# Patient Record
Sex: Female | Born: 1950 | Race: White | Hispanic: No | Smoking: Current every day smoker
Health system: Southern US, Community
[De-identification: ages and names within clinical notes are randomized; demographics above are authoritative.]

## PROBLEM LIST (undated history)

## (undated) DIAGNOSIS — C801 Malignant (primary) neoplasm, unspecified: Secondary | ICD-10-CM

## (undated) DIAGNOSIS — I10 Essential (primary) hypertension: Secondary | ICD-10-CM

## (undated) DIAGNOSIS — F329 Major depressive disorder, single episode, unspecified: Secondary | ICD-10-CM

## (undated) HISTORY — PX: HERNIA REPAIR: SHX51

## (undated) HISTORY — PX: ABDOMINAL HYSTERECTOMY: SHX81

## (undated) HISTORY — PX: OTHER SURGICAL HISTORY: SHX169

---

## 2006-09-13 ENCOUNTER — Other Ambulatory Visit: Payer: Self-pay

## 2006-09-13 ENCOUNTER — Emergency Department: Payer: Self-pay | Admitting: Emergency Medicine

## 2006-11-02 ENCOUNTER — Ambulatory Visit: Payer: Self-pay | Admitting: Gastroenterology

## 2007-04-25 ENCOUNTER — Ambulatory Visit: Payer: Self-pay | Admitting: Family Medicine

## 2008-04-25 ENCOUNTER — Ambulatory Visit: Payer: Self-pay | Admitting: Family Medicine

## 2008-05-22 ENCOUNTER — Other Ambulatory Visit: Payer: Self-pay

## 2008-05-22 ENCOUNTER — Ambulatory Visit: Payer: Self-pay | Admitting: Specialist

## 2008-05-29 ENCOUNTER — Ambulatory Visit: Payer: Self-pay | Admitting: Specialist

## 2008-08-07 ENCOUNTER — Emergency Department: Payer: Self-pay | Admitting: Emergency Medicine

## 2008-08-07 ENCOUNTER — Other Ambulatory Visit: Payer: Self-pay

## 2008-10-09 ENCOUNTER — Encounter: Payer: Self-pay | Admitting: Specialist

## 2008-10-21 ENCOUNTER — Ambulatory Visit: Payer: Self-pay | Admitting: Pain Medicine

## 2008-10-22 ENCOUNTER — Ambulatory Visit: Payer: Self-pay | Admitting: Pain Medicine

## 2008-11-06 ENCOUNTER — Ambulatory Visit: Payer: Self-pay | Admitting: Physician Assistant

## 2009-01-01 ENCOUNTER — Ambulatory Visit: Payer: Self-pay | Admitting: Pain Medicine

## 2009-01-07 ENCOUNTER — Ambulatory Visit: Payer: Self-pay | Admitting: Pain Medicine

## 2009-01-21 ENCOUNTER — Ambulatory Visit: Payer: Self-pay | Admitting: Physician Assistant

## 2009-04-29 ENCOUNTER — Ambulatory Visit: Payer: Self-pay | Admitting: Family Medicine

## 2010-05-04 ENCOUNTER — Ambulatory Visit: Payer: Self-pay | Admitting: Family Medicine

## 2011-05-10 ENCOUNTER — Ambulatory Visit: Payer: Self-pay | Admitting: Family Medicine

## 2011-11-30 DIAGNOSIS — F32A Depression, unspecified: Secondary | ICD-10-CM

## 2011-11-30 HISTORY — DX: Depression, unspecified: F32.A

## 2012-06-05 ENCOUNTER — Ambulatory Visit: Payer: Self-pay | Admitting: Family Medicine

## 2012-07-11 ENCOUNTER — Ambulatory Visit: Payer: Self-pay | Admitting: Gynecologic Oncology

## 2012-07-25 ENCOUNTER — Ambulatory Visit: Payer: Self-pay | Admitting: Gynecologic Oncology

## 2012-07-25 LAB — CBC
HCT: 39.3 % (ref 35.0–47.0)
MCHC: 34 g/dL (ref 32.0–36.0)
MCV: 94 fL (ref 80–100)
Platelet: 193 10*3/uL (ref 150–440)
RDW: 13 % (ref 11.5–14.5)
WBC: 7 10*3/uL (ref 3.6–11.0)

## 2012-07-25 LAB — BASIC METABOLIC PANEL
Calcium, Total: 8.8 mg/dL (ref 8.5–10.1)
Chloride: 108 mmol/L — ABNORMAL HIGH (ref 98–107)
Osmolality: 277 (ref 275–301)
Potassium: 3.4 mmol/L — ABNORMAL LOW (ref 3.5–5.1)
Sodium: 140 mmol/L (ref 136–145)

## 2012-07-30 ENCOUNTER — Ambulatory Visit: Payer: Self-pay | Admitting: Gynecologic Oncology

## 2012-08-01 ENCOUNTER — Ambulatory Visit: Payer: Self-pay | Admitting: Gynecologic Oncology

## 2012-08-29 ENCOUNTER — Ambulatory Visit: Payer: Self-pay | Admitting: Gynecologic Oncology

## 2012-09-29 ENCOUNTER — Ambulatory Visit: Payer: Self-pay | Admitting: Gynecologic Oncology

## 2012-10-03 ENCOUNTER — Ambulatory Visit: Payer: Self-pay | Admitting: Gynecologic Oncology

## 2012-10-29 ENCOUNTER — Ambulatory Visit: Payer: Self-pay | Admitting: Gynecologic Oncology

## 2013-01-15 ENCOUNTER — Ambulatory Visit: Payer: Self-pay | Admitting: Gynecologic Oncology

## 2013-01-27 ENCOUNTER — Ambulatory Visit: Payer: Self-pay | Admitting: Gynecologic Oncology

## 2013-02-01 ENCOUNTER — Ambulatory Visit: Payer: Self-pay | Admitting: Gynecologic Oncology

## 2013-02-01 LAB — BASIC METABOLIC PANEL
Anion Gap: 3 — ABNORMAL LOW (ref 7–16)
BUN: 7 mg/dL (ref 7–18)
Chloride: 107 mmol/L (ref 98–107)
Co2: 30 mmol/L (ref 21–32)
EGFR (African American): >60
Potassium: 2.7 mmol/L — ABNORMAL LOW (ref 3.5–5.1)

## 2013-02-01 LAB — CBC
HGB: 13.5 g/dL (ref 12.0–16.0)
MCH: 31.4 pg (ref 26.0–34.0)
Platelet: 198 10*3/uL (ref 150–440)
RDW: 13.2 % (ref 11.5–14.5)

## 2013-02-06 ENCOUNTER — Ambulatory Visit: Payer: Self-pay | Admitting: Gynecologic Oncology

## 2013-02-08 LAB — PATHOLOGY REPORT

## 2013-02-27 ENCOUNTER — Ambulatory Visit: Payer: Self-pay | Admitting: Gynecologic Oncology

## 2013-03-29 ENCOUNTER — Ambulatory Visit: Payer: Self-pay | Admitting: Gynecologic Oncology

## 2013-04-01 ENCOUNTER — Emergency Department: Payer: Self-pay | Admitting: Emergency Medicine

## 2013-04-01 LAB — CK TOTAL AND CKMB (NOT AT ARMC): CK-MB: <0.5 ng/mL — ABNORMAL LOW (ref 0.5–3.6)

## 2013-04-01 LAB — PRO B NATRIURETIC PEPTIDE: B-Type Natriuretic Peptide: 73 pg/mL (ref 0–125)

## 2013-04-01 LAB — BASIC METABOLIC PANEL
Anion Gap: 7 (ref 7–16)
BUN: 6 mg/dL — ABNORMAL LOW (ref 7–18)
Chloride: 111 mmol/L — ABNORMAL HIGH (ref 98–107)
Potassium: 2.8 mmol/L — ABNORMAL LOW (ref 3.5–5.1)
Sodium: 142 mmol/L (ref 136–145)

## 2013-04-01 LAB — CBC
HGB: 13.3 g/dL (ref 12.0–16.0)
MCHC: 34.4 g/dL (ref 32.0–36.0)
RDW: 13.5 % (ref 11.5–14.5)

## 2013-04-01 LAB — MAGNESIUM: Magnesium: 1.3 mg/dL — ABNORMAL LOW

## 2013-04-13 ENCOUNTER — Ambulatory Visit: Payer: Self-pay | Admitting: Family Medicine

## 2013-04-14 LAB — CBC
HCT: 36.6 % (ref 35.0–47.0)
HGB: 12.6 g/dL (ref 12.0–16.0)
MCHC: 34.4 g/dL (ref 32.0–36.0)
MCV: 91 fL (ref 80–100)
Platelet: 204 10*3/uL (ref 150–440)
RBC: 4.03 10*6/uL (ref 3.80–5.20)
RDW: 13.1 % (ref 11.5–14.5)

## 2013-04-15 ENCOUNTER — Inpatient Hospital Stay: Payer: Self-pay | Admitting: Internal Medicine

## 2013-04-15 LAB — URINALYSIS, COMPLETE
Bacteria: NONE SEEN
Bilirubin,UR: NEGATIVE
Blood: NEGATIVE
Glucose,UR: NEGATIVE mg/dL (ref 0–75)
Ketone: NEGATIVE
Leukocyte Esterase: NEGATIVE
Nitrite: NEGATIVE
RBC,UR: <1 /HPF (ref 0–5)
Squamous Epithelial: NONE SEEN
WBC UR: 1 /HPF (ref 0–5)

## 2013-04-15 LAB — COMPREHENSIVE METABOLIC PANEL
Albumin: 3.5 g/dL (ref 3.4–5.0)
Alkaline Phosphatase: 77 U/L (ref 50–136)
Anion Gap: 6 — ABNORMAL LOW (ref 7–16)
Bilirubin,Total: 0.2 mg/dL (ref 0.2–1.0)
Chloride: 111 mmol/L — ABNORMAL HIGH (ref 98–107)
Co2: 26 mmol/L (ref 21–32)
EGFR (African American): >60
EGFR (Non-African Amer.): >60
Glucose: 89 mg/dL (ref 65–99)
Potassium: 3.6 mmol/L (ref 3.5–5.1)
SGOT(AST): 19 U/L (ref 15–37)
SGPT (ALT): 19 U/L (ref 12–78)
Total Protein: 6.6 g/dL (ref 6.4–8.2)

## 2013-04-15 LAB — TROPONIN I: Troponin-I: <0.02 ng/mL

## 2013-04-15 LAB — LIPID PANEL
Cholesterol: 138 mg/dL (ref 0–200)
VLDL Cholesterol, Calc: 26 mg/dL (ref 5–40)

## 2013-04-18 DIAGNOSIS — Z01810 Encounter for preprocedural cardiovascular examination: Secondary | ICD-10-CM

## 2013-04-18 DIAGNOSIS — I059 Rheumatic mitral valve disease, unspecified: Secondary | ICD-10-CM

## 2013-04-18 LAB — CREATININE, SERUM
Creatinine: 0.61 mg/dL (ref 0.60–1.30)
EGFR (African American): >60

## 2013-04-20 LAB — CBC WITH DIFFERENTIAL/PLATELET
Basophil #: 0 10*3/uL (ref 0.0–0.1)
Eosinophil #: 0.1 10*3/uL (ref 0.0–0.7)
HCT: 27.2 % — ABNORMAL LOW (ref 35.0–47.0)
HGB: 9.4 g/dL — ABNORMAL LOW (ref 12.0–16.0)
Lymphocyte #: 3 10*3/uL (ref 1.0–3.6)
Lymphocyte %: 39.3 %
MCH: 31.7 pg (ref 26.0–34.0)
MCV: 91 fL (ref 80–100)
Neutrophil %: 50.5 %
Platelet: 133 10*3/uL — ABNORMAL LOW (ref 150–440)
RBC: 2.98 10*6/uL — ABNORMAL LOW (ref 3.80–5.20)

## 2013-04-20 LAB — BASIC METABOLIC PANEL
Calcium, Total: 8.8 mg/dL (ref 8.5–10.1)
Chloride: 110 mmol/L — ABNORMAL HIGH (ref 98–107)
EGFR (African American): >60
EGFR (Non-African Amer.): >60
Osmolality: 278 (ref 275–301)
Potassium: 3.6 mmol/L (ref 3.5–5.1)

## 2013-04-20 LAB — PROTIME-INR: Prothrombin Time: 13.7 secs (ref 11.5–14.7)

## 2013-04-21 LAB — BASIC METABOLIC PANEL
Anion Gap: 5 — ABNORMAL LOW (ref 7–16)
BUN: 7 mg/dL (ref 7–18)
Calcium, Total: 8.4 mg/dL — ABNORMAL LOW (ref 8.5–10.1)
Co2: 27 mmol/L (ref 21–32)
Creatinine: 0.46 mg/dL — ABNORMAL LOW (ref 0.60–1.30)
EGFR (African American): >60

## 2013-04-21 LAB — CBC WITH DIFFERENTIAL/PLATELET
Basophil #: 0 10*3/uL (ref 0.0–0.1)
Basophil %: 0.6 %
Eosinophil #: 0.1 10*3/uL (ref 0.0–0.7)
Eosinophil %: 0.8 %
HCT: 24.8 % — ABNORMAL LOW (ref 35.0–47.0)
HGB: 8.4 g/dL — ABNORMAL LOW (ref 12.0–16.0)
Lymphocyte #: 2.1 10*3/uL (ref 1.0–3.6)
MCH: 31 pg (ref 26.0–34.0)
MCHC: 33.9 g/dL (ref 32.0–36.0)
MCV: 91 fL (ref 80–100)
Monocyte #: 0.5 x10 3/mm (ref 0.2–0.9)
Neutrophil #: 4.1 10*3/uL (ref 1.4–6.5)
Neutrophil %: 60.3 %
Platelet: 121 10*3/uL — ABNORMAL LOW (ref 150–440)
RBC: 2.71 10*6/uL — ABNORMAL LOW (ref 3.80–5.20)
RDW: 13.1 % (ref 11.5–14.5)
WBC: 6.9 10*3/uL (ref 3.6–11.0)

## 2013-04-21 LAB — PROTIME-INR: INR: 1

## 2013-04-21 LAB — APTT: Activated PTT: 35.5 secs (ref 23.6–35.9)

## 2013-04-24 LAB — PATHOLOGY REPORT

## 2013-06-15 ENCOUNTER — Ambulatory Visit: Payer: Self-pay | Admitting: Gynecologic Oncology

## 2013-06-29 ENCOUNTER — Ambulatory Visit: Payer: Self-pay | Admitting: Gynecologic Oncology

## 2013-07-16 ENCOUNTER — Ambulatory Visit: Payer: Self-pay | Admitting: Family Medicine

## 2013-07-19 ENCOUNTER — Ambulatory Visit: Payer: Self-pay | Admitting: Family Medicine

## 2013-12-22 IMAGING — CR DG CHEST 2V
1 series · 2 of 2 positions shown · non-contrast
Comparison: none

REASON FOR EXAM: cough, eye twitching and difficulty with speech,
previous pneumonia
COMMENTS:

[Series 3: w chest pa · 0.14mm/px · 2 of 2 slices shown]
[im 1/2]
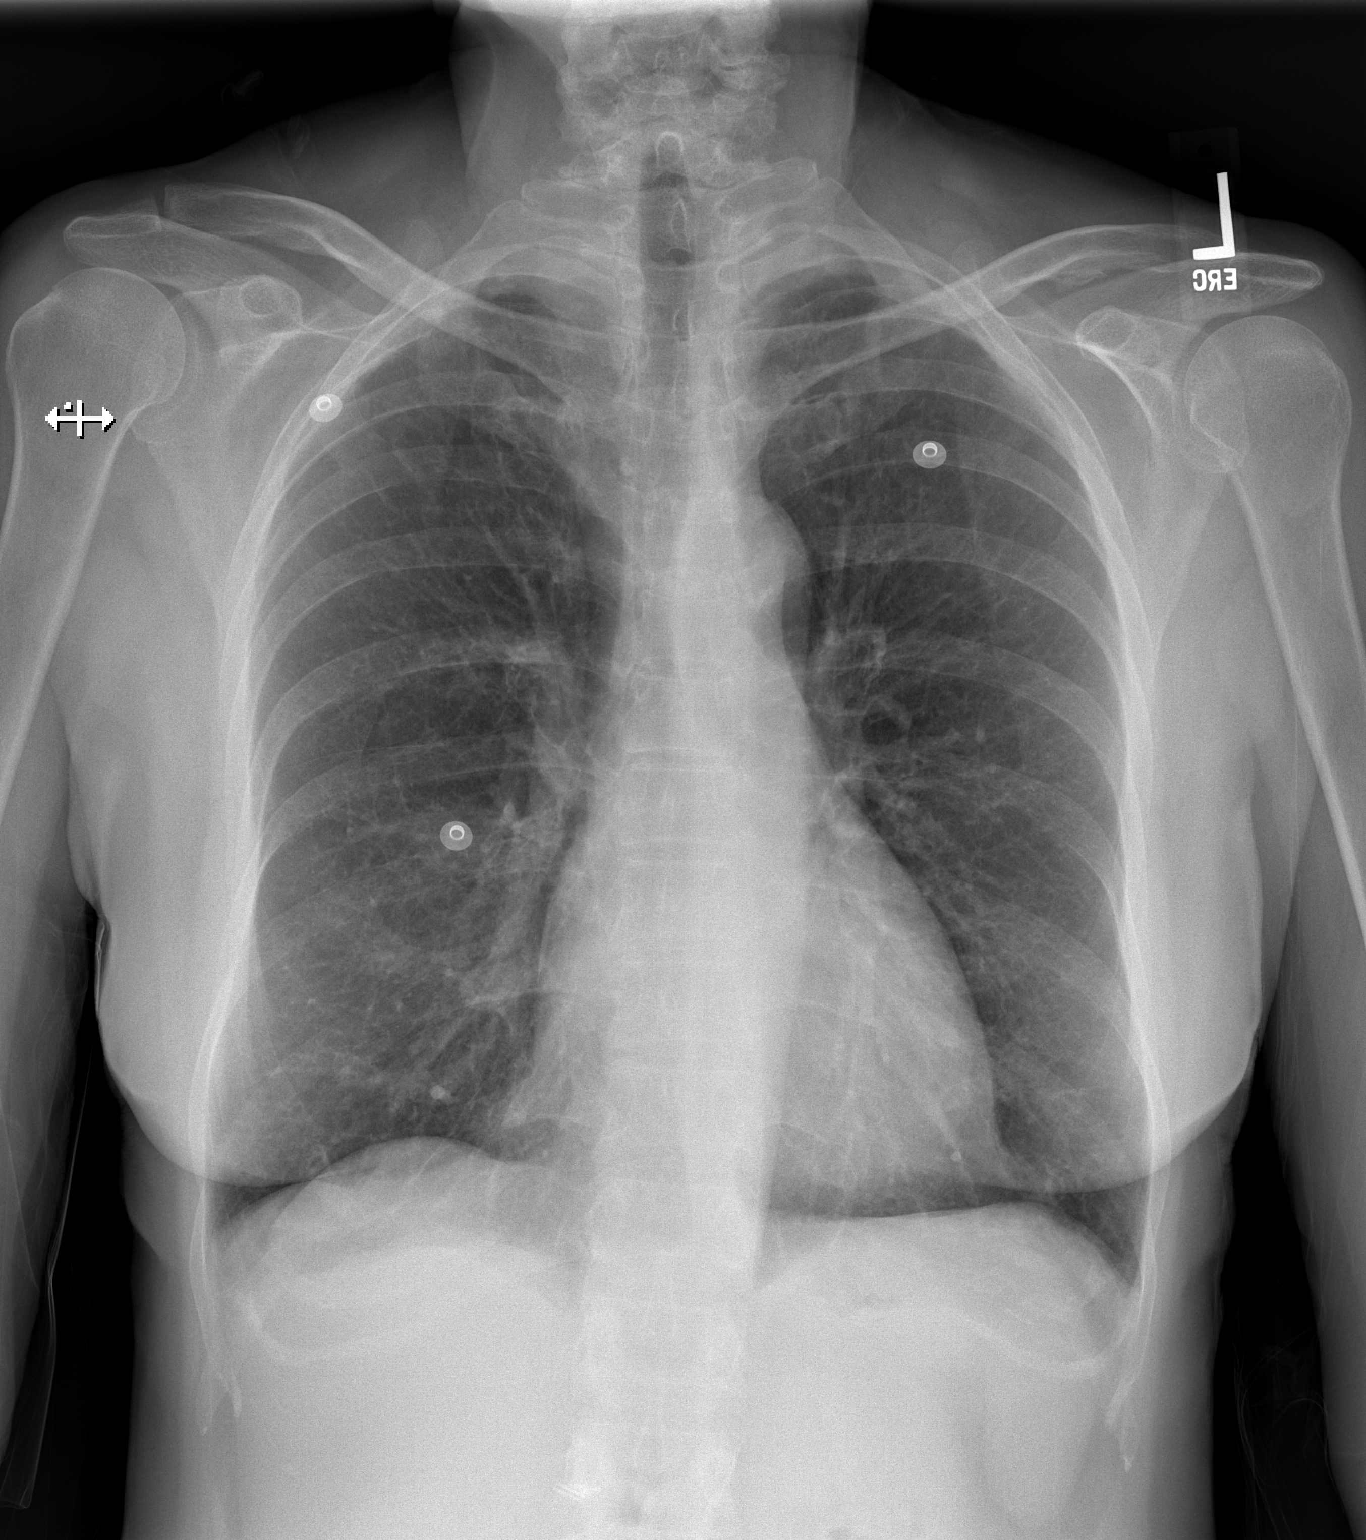
[im 2/2]
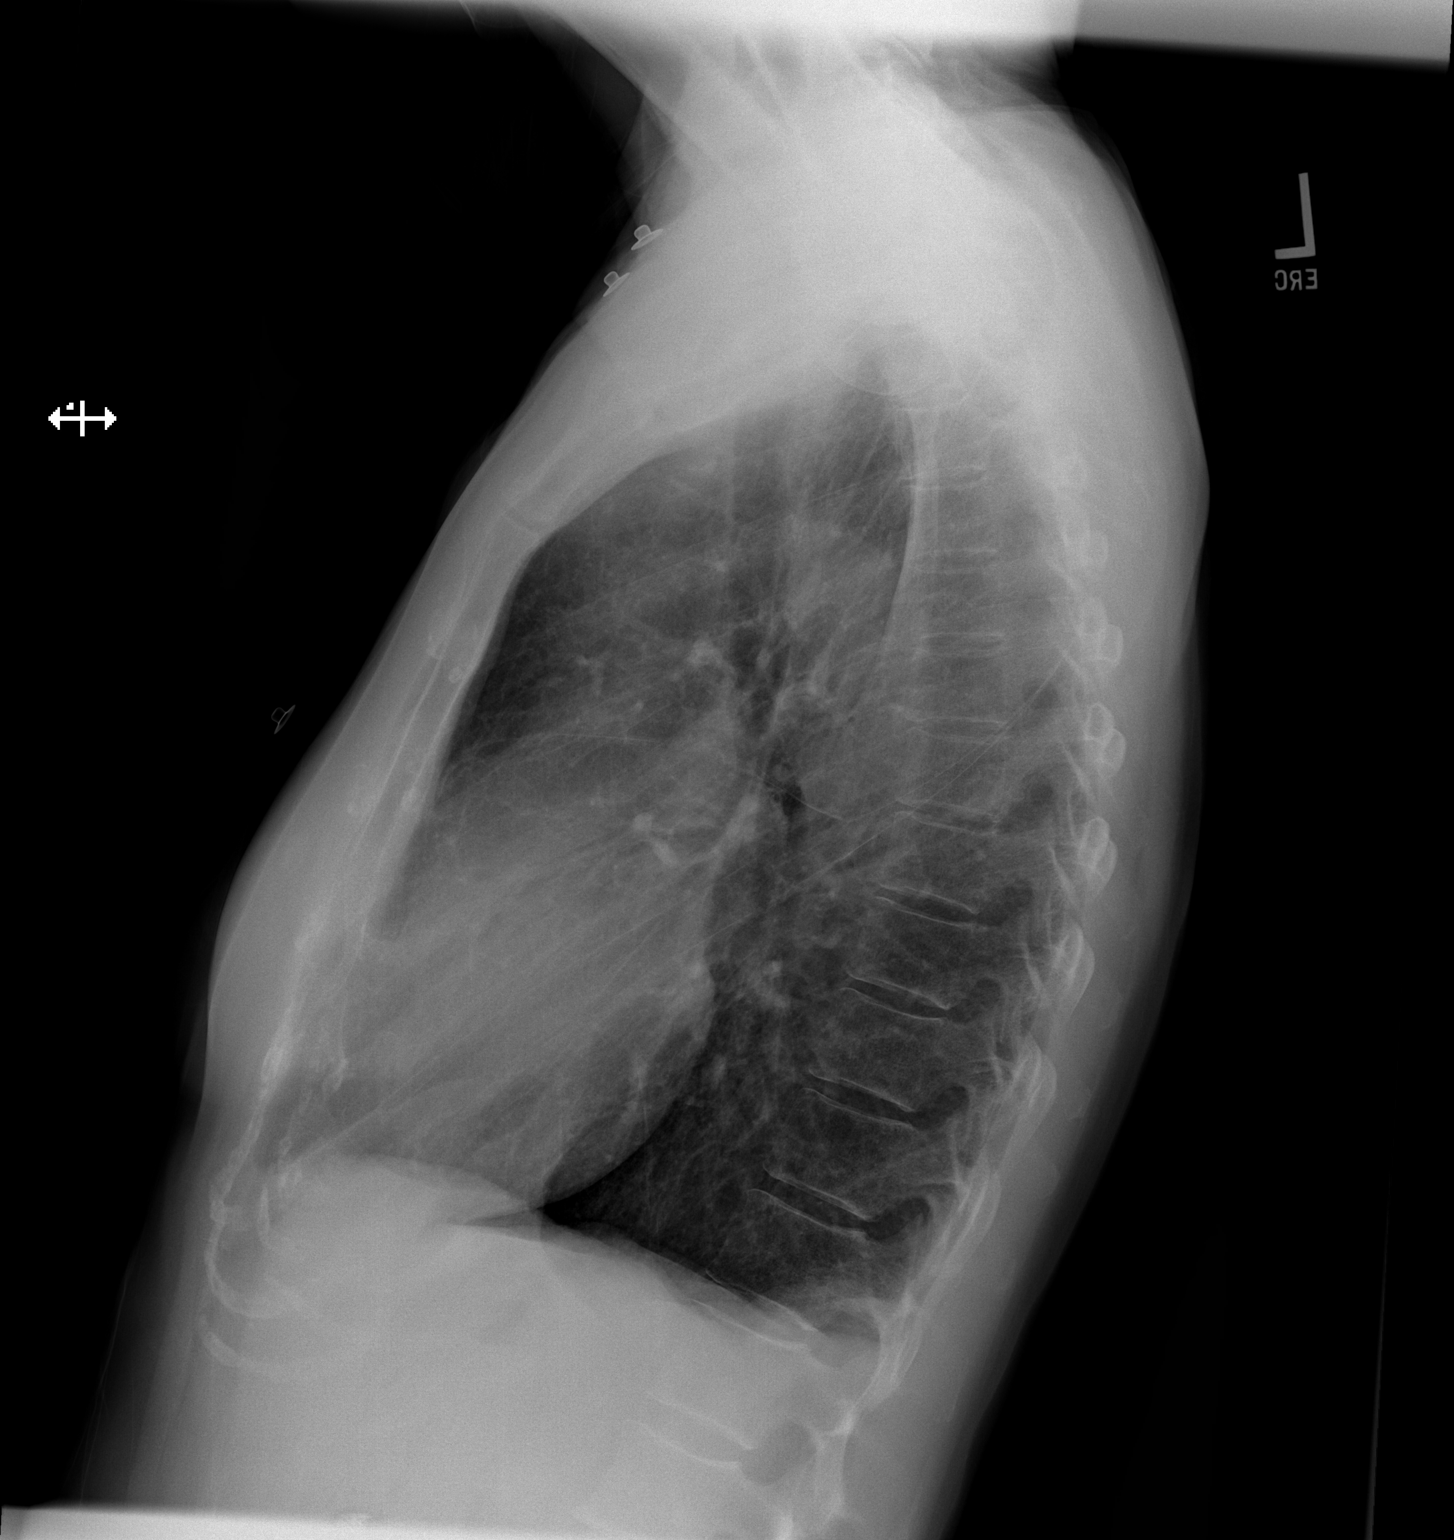

[2 of 2 positions shown; findings below may reference images not displayed]

PROCEDURE:     DXR - DXR CHEST PA (OR AP) AND LATERAL  - April 15, 2013 [DATE]

RESULT:     Comparison made to the study April 01, 2013.

The lungs remain hyperinflated. There is no focal infiltrate. The cardiac
silhouette is normal in size. The pulmonary vascularity is not engorged. The
perihilar lung markings are mildly prominent.
IMPRESSION: The findings are consistent with COPD and likely
superimposed acute bronchitis. There is no focal pneumonia. There is no
significant change since a study of earlier this month. A followup films
following any therapy is recommended to assure clearing. Chest CT scanning
may be ultimately indicated.

[REDACTED]

## 2013-12-22 IMAGING — US US CAROTID DUPLEX BILAT
1 series · 13 of 24 positions shown · non-contrast
Comparison: none

REASON FOR EXAM: TIA and left carotid stenosis
COMMENTS:   LMP: Post-Menopausal

[Series 1: us carotid duplex bilat · 0.08mm/px · 13 of 68 slices shown]
[im 1/68]
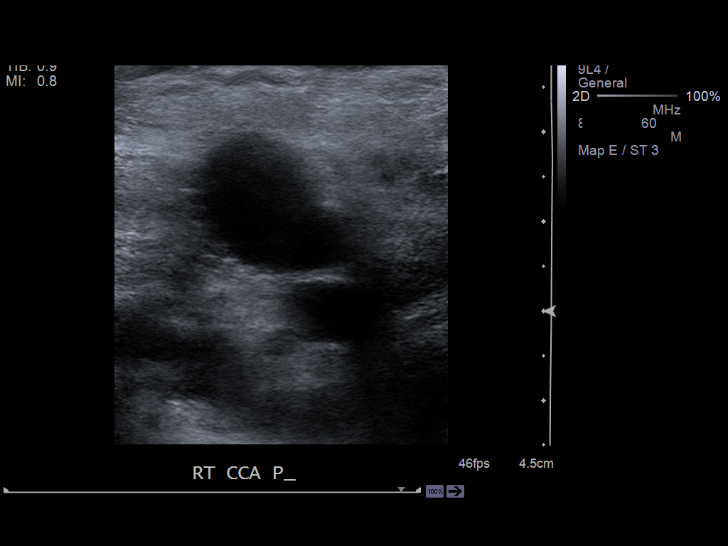
[im 6/68]
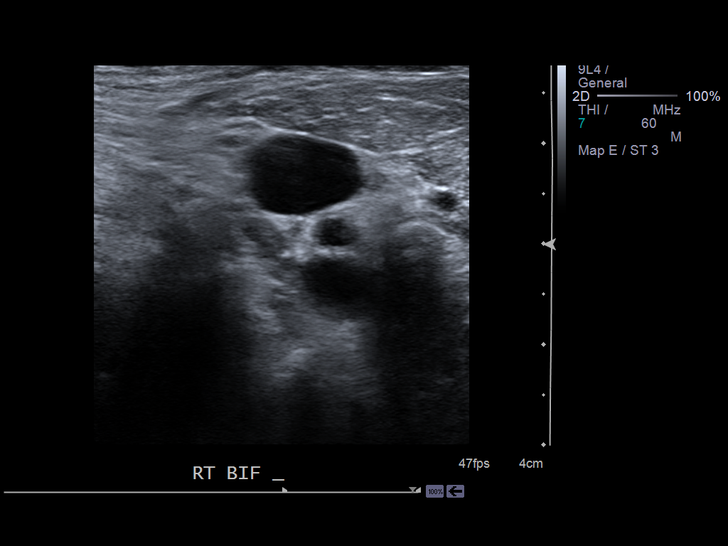
[im 12/68]
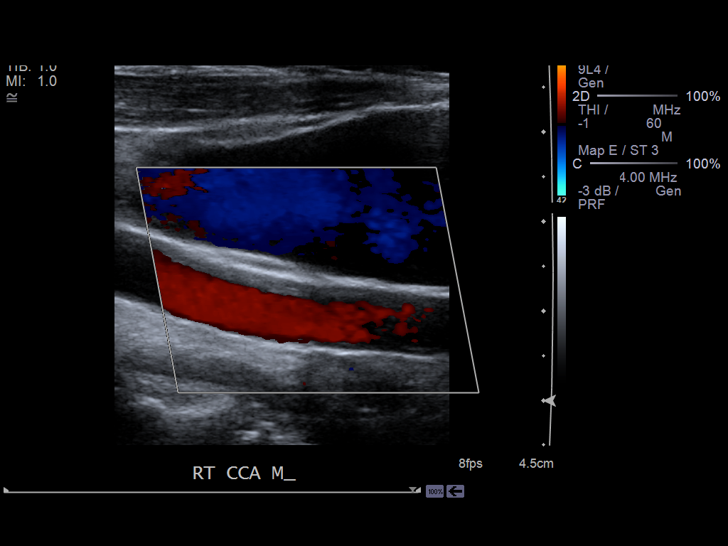
[im 18/68]
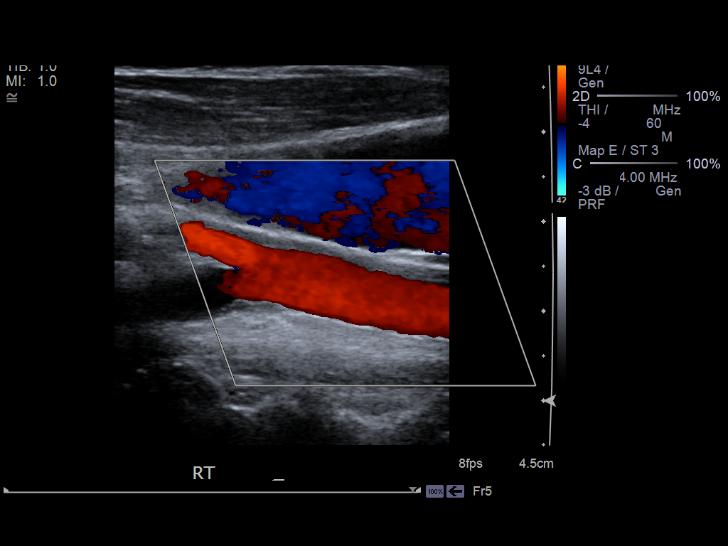
[im 24/68]
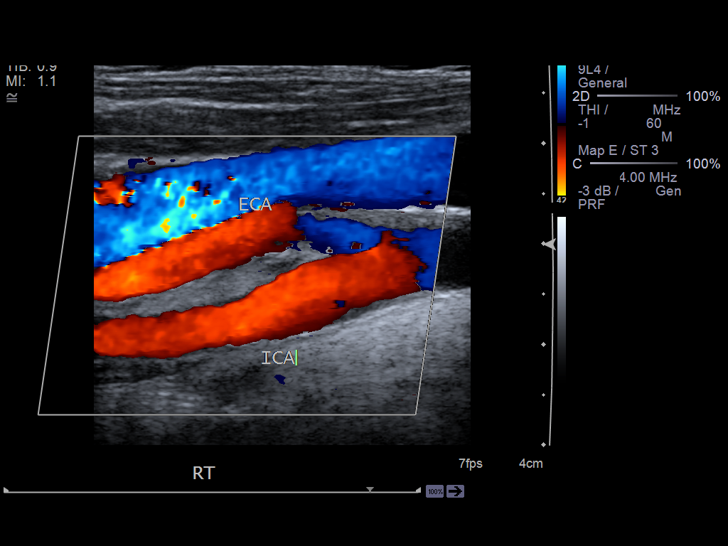
[im 30/68]
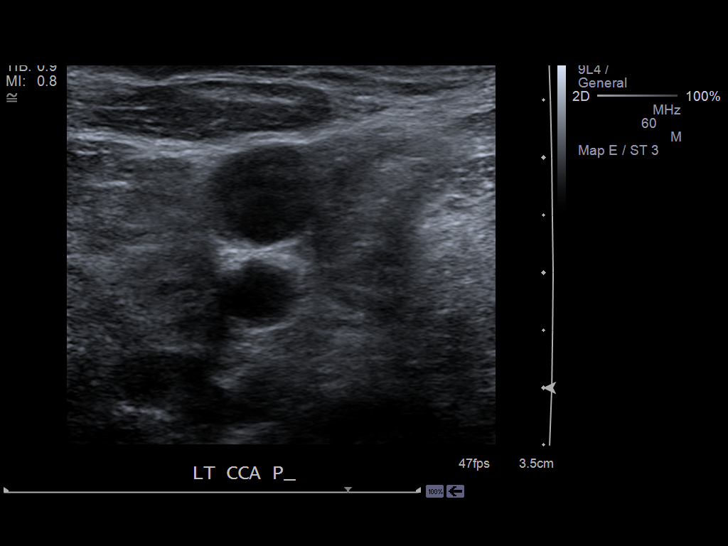
[im 35/68]
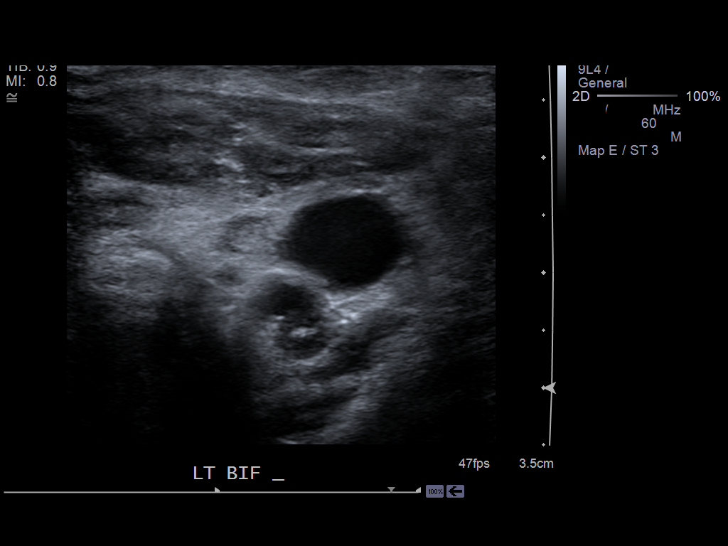
[im 38/68]
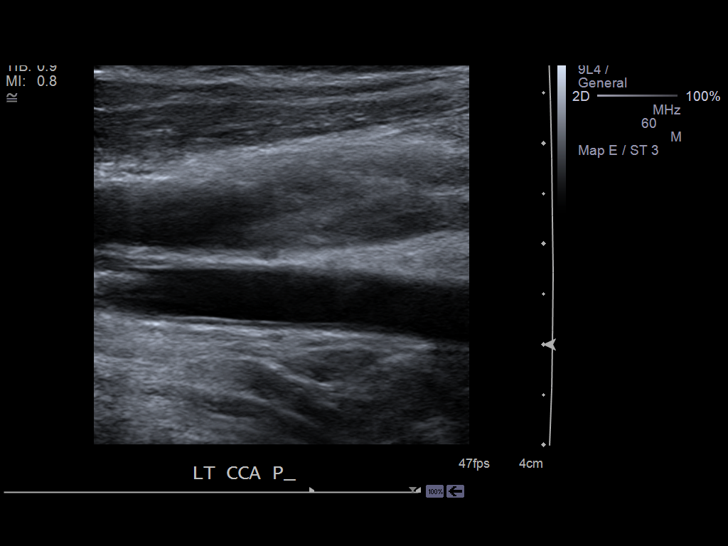
[im 44/68]
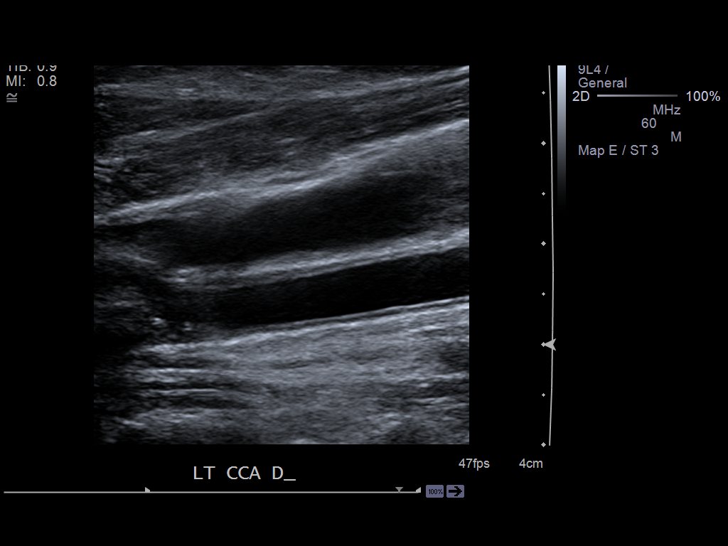
[im 50/68]
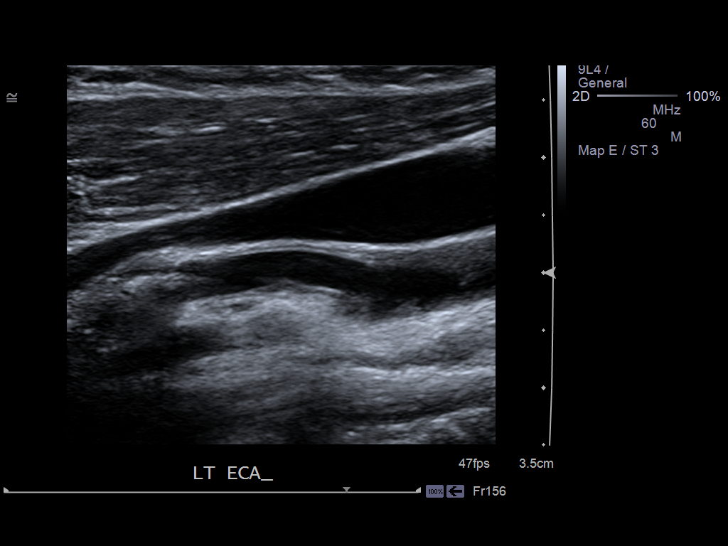
[im 56/68]
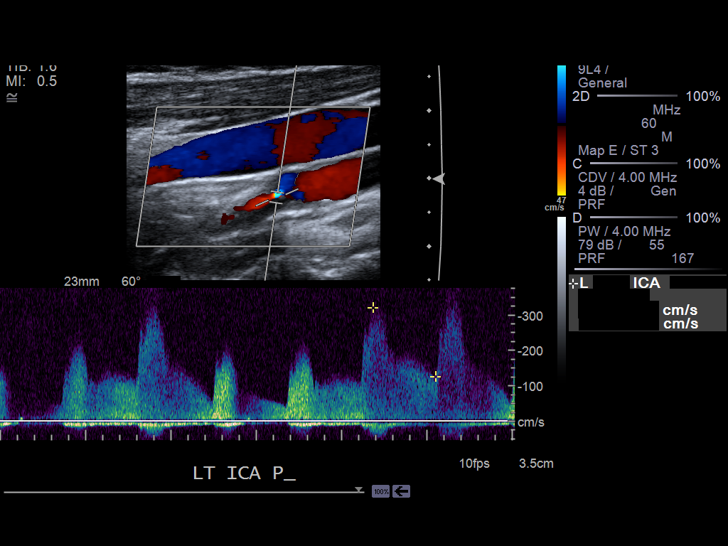
[im 62/68]
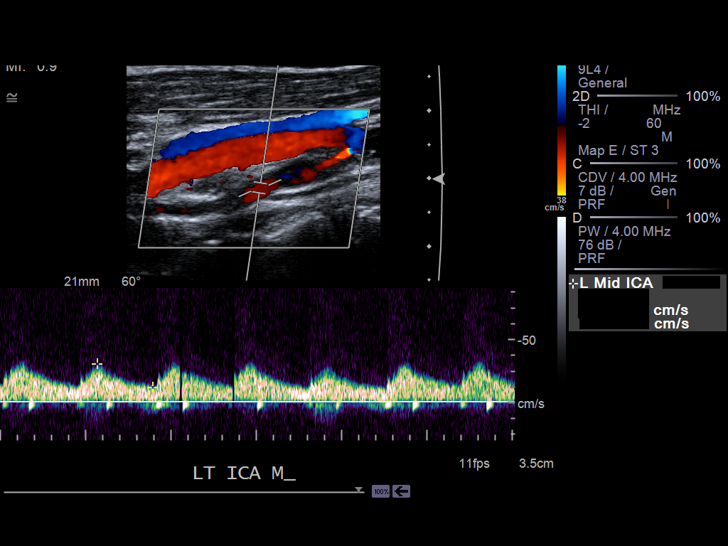
[im 68/68]
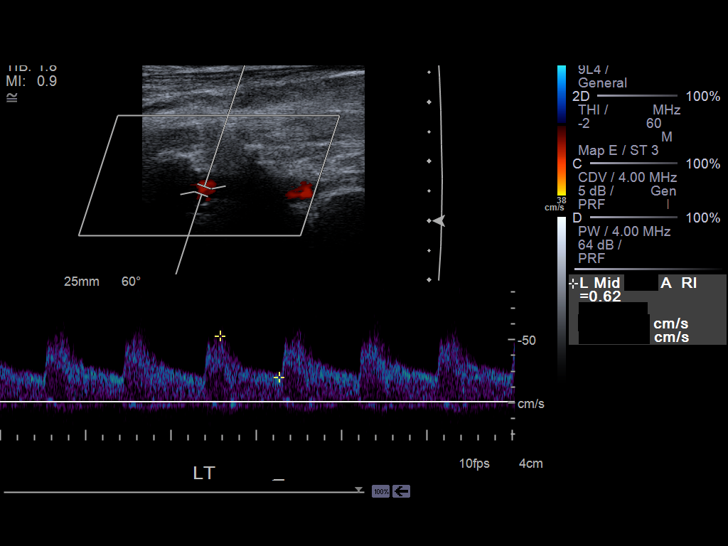

[13 of 24 positions shown; findings below may reference images not displayed]

PROCEDURE:     US  - US CAROTID DOPPLER BILATERAL  - April 15, 2013  [DATE]

RESULT:     Grayscale and color flow Doppler techniques or employed to
evaluate the cervical carotid systems.

On the right there is a small moderate amount of soft plaque throughout the
carotid system. The waveform patterns and color flow images do not suggest
significant turbulence. The peak internal carotid systolic velocity on the
right measures 91 cm/sec and the peak common carotid velocity measured 125
cm/sec corresponding to a normal ratio of 0.7.

On the left there is soft plaque in the distal CCA and there is soft and
calcified plaque at the bulb and proximal ICA. There is turbulence in the
proximal ICA and visually the degree of stenosis is between 75 and 95% in
the proximal ICA. Peak internal carotid systolic velocity is accelerated to
328 cm/sec and the peak common carotid velocity measures 76 cm/sec
corresponding to an abnormal ratio of 4.3.

The vertebral arteries are normal in flow direction bilaterally.
IMPRESSION: 1. Calcified and soft plaque in the proximal ICA is consistent with a degree
of stenosis of 75 to 95%.
2. On the right only a small to amount of soft plaque is noted throughout
the carotid system and normal velocities are demonstrated.
3. The vertebral arteries are normal in flow direction bilaterally.

A preliminary report was sent to the [HOSPITAL] the conclusion
of the study.

[REDACTED]

## 2014-01-14 ENCOUNTER — Ambulatory Visit: Payer: Self-pay | Admitting: Gynecologic Oncology

## 2015-03-18 NOTE — Op Note (Signed)
PATIENT NAME:  Ann Poole, Ann Poole MR#:  [id number] DATE OF BIRTH:  Jul 11, 1951  DATE OF PROCEDURE:  08/01/2012  PREOPERATIVE DIAGNOSIS: Condyloma of vulva and vagina.   POSTOPERATIVE DIAGNOSIS:  Condyloma of vulva and vagina.   PROCEDURE PERFORMED: Examination under anesthesia and laser vaporization.   SURGEON: Weber Cooks, M.D.   ANESTHESIA: General.   COMPLICATIONS: None.   INDICATION FOR SURGERY: Mrs. Cressy is a 64 year old patient who was noted to have moderate volume condyloma in her vagina and small volume condyloma on the vulva. Therefore, the decision was made to proceed with laser vaporization.   FINDINGS AT THE TIME OF SURGERY: External genitalia within normal limits except for small volume condyloma on the posterior fourchette. No clinical evidence of VIN. Moderate volume condyloma on the vagina mainly on the right pelvic sidewall. The vaginal cuff is well healed. On bimanual examination no masses or nodularities.   OPERATIVE REPORT: After adequate general anesthesia had been obtained, the patient was prepped and draped in high lithotomy position. Inspection was done with the above-mentioned findings.   First, attention was directed towards the vagina. Using the CO2 laser at a wattage of 22, the  condylomatous lesions inside the vagina were lasered in layers following the Reed technique. Adequate hemostasis was confirmed. Then attention was directed towards the vulva where again at the same wattage using the continuous CO2 laser, the condylomatous lesions were vaporized. Then Silvadene cream and lidocaine gel were placed inside the vagina and on the vulva.     The patient tolerated the procedure well and was taken to the recovery room in stable condition. Pad, sponge, needle, and instrument counts were correct times two.    ____________________________ Weber Cooks, MD bem:bjt D: 08/01/2012 16:49:29 ET T: 08/01/2012 17:51:01 ET JOB#: [id number]  cc: Weber Cooks, MD, <Dictator> Weber Cooks MD ELECTRONICALLY SIGNED 08/08/2012 10:49

## 2015-03-21 NOTE — Discharge Summary (Signed)
PATIENT NAME:  Ann Poole, Ann Poole MR#:  [id number] DATE OF BIRTH:  September 13, 1951  DATE OF ADMISSION:  04/15/2013 DATE OF DISCHARGE:  04/21/2013  DISCHARGE DIAGNOSES: 1.  Carotid artery stenosis, transient ischemic attack episode, status post surgery on 04/20/2013. 2.  Hypertension.  3.  Anemia due to blood loss in surgery. 4.  CODE STATUS ON DISCHARGE: Full code.  CONDITION ON DISCHARGE: Stable.   MEDICATIONS ON DISCHARGE:  1.  Simvastatin 10 mg oral tablet once a day. 2.  Trazodone 100 mg tablet once a day. 3.  Cyclobenzaprine 5 mg oral tablet as needed.  4.  Citalopram 10 mg oral tablet once a day. 5.  Aspirin 81 mg once a day.  6.  Ferrous sulfate 325 mg 3 times a day.  7. Docusate sodium 240 mg capsule every 12 hours as needed for constipation.  8.  Nicotine patch once a day.  10.  Acetaminophen/hydrocodone 320-5/5 oral tablet every 4 to 6 hours for 5 days.   DIET ON DISCHARGE: Low sodium, low fat, low cholesterol, regular consistency diet.   ACTIVITY: As tolerated.   FOLLOW UP:  Timeframe to follow-up within 2 to 4 weeks with primary care physician and follow up with PMD for hemoglobin check in 3 to 4 weeks.   HISTORY OF PRESENT ILLNESS: This 64 year old Caucasian female with history of hypertension, chronic smoker who was in her usual state of health until 3 to 4 days ago when she had an episode of left-sided headache and fluttering of the vision of her right eye associated with some speech problem also.  She went to her primary care physician and told about this thing. There was possibility of TIA and she was scheduled to have carotid ultrasound and echocardiogram next week. MRI was also done as outpatient. The patient also had similar episode the next day and she was told to go to the Emergency Department.  So she came with slurred speech As per MRI, there was a senior thrombus or stenosis of the left internal carotid artery. The patient was admitted for further management.    HOSPITAL COURSE AND STAY:   1.  Transient ischemic attack. She remained symptom free while in the hospital. Vascular consult was done for left internal carotid artery stenosis or thrombus and they scheduled her for surgery but wanted cardiology clearance which was done.  As cardiology cleared for the surgery, the surgery was scheduled after 4 to 5 days of hospital stay, which was done without any complications. The patient tolerated surgery very well and the next day was walking and without any problem, so discharged home with aspirin.  Advised as to smoking cessation and statin.  2.  Other medical issues are anemia due to blood loss in surgery and dilutional due to IV fluid.  We started on oral supplementation and advised to follow with primary care.  3.  Hypokalemia. Oral supplementation.  4.  Hypotension.  Blood pressure came under control with medication.  5.  Hypercholesterolemia. Advised to go with statin. 6.  Smoking.  Was on a nicotine patch in the hospital and sent home with the same.   CONSULTATIONS IN THE HOSPITAL: 1.  vascular consult with Dr. Delana Meyer.  2.  Cardiology consult with Dr. Rockey Situ.  LABORATORY RESULTS IN THE HOSPITAL:  MRI of brain without contrast, severe stenosis, possible thrombus of left internal carotid  artery which was done as outpatient on 04/13/2013.  Triglyceride was 129, HDL 32. CT head without contrast: No acute intracranial abnormality. CT  angiogram confirmed those findings. Echocardiogram of the heart essentially normal study, left ventricular ejection fraction 55% to 60%. Normal size ventricle and wall thickness, normal right ventricular size and systolic function. Hemoglobin was 12.6 on admission and after surgery it came down to 8.4, We sent her home with replacement orally.   Total time spent on this discharge: 45 minutes.    ____________________________ Ceasar Lund Anselm Jungling, MD vgv:ct D: 04/25/2013 07:12:33 ET T: 04/25/2013 08:43:37  ET JOB#: [id number]  cc: Ceasar Lund. Anselm Jungling, MD, <Dictator> Katha Cabal, MD Primary care physician  Vaughan Basta MD ELECTRONICALLY SIGNED 04/26/2013 22:37

## 2015-03-21 NOTE — Consult Note (Signed)
General Aspect 64 year old Caucasian female with history of hypertension, She is a chronic smoker, presenting after an episode of left-sided headache associated with fluttering of vision in the right eye, at that time associated with some speech problem. found to have severe carotid arterial disease, planned for carotid endarterectomy tomorrow. Cardiology was consulted for preop evaluation.  Patient reports stuttering episodes over the past week or so of TIA-type symptoms with speech changes, memory problem. Symptoms have resolved. She saw her primary care physician and she was told that she had possibly she had a TIA. She was scheduled to have carotid ultrasound and echocardiogram next week. she had an MRI done this Friday. she came to the hospital because she developed a  left-sided headache associated with a fluttering vision in the right eye that lasted about 20 minutes and then subsided. This time did not associate with slurred speech   MRI done on 05/16 showed severe catotid stenosis with possible thrombus in the left internal carotid artery.   Present Illness . FAMILY HISTORY: He father died from complications of pneumonia and he was suffering from [place]???s dementia. Her mother died at age of 38 from COPD complications.   SOCIAL HISTORY: She is divorced. She works as a Gaffer.   SOCIAL HABITS: Chronic smoker of 1/2 pack a day since age of 19. She drinks alcohol only occasionally. No other drug abuse.   Physical Exam:  GEN well developed, well nourished, no acute distress   HEENT hearing intact to voice, moist oral mucosa, Oropharynx clear   NECK supple   RESP normal resp effort  clear BS   CARD Regular rate and rhythm  Normal, S1, S2   ABD denies tenderness  normal BS   LYMPH negative neck   EXTR negative edema   SKIN normal to palpation   NEURO motor/sensory function intact   PSYCH alert, A+O to time, place, person, good insight   Review of Systems:   Subjective/Chief Complaint headache, speech abn   General: No Complaints   Skin: No Complaints   ENT: No Complaints   Eyes: No Complaints   Neck: No Complaints   Respiratory: No Complaints   Cardiovascular: No Complaints   Gastrointestinal: No Complaints   Genitourinary: No Complaints   Vascular: No Complaints   Musculoskeletal: No Complaints   Neurologic: No Complaints   Hematologic: No Complaints   Endocrine: No Complaints   Psychiatric: No Complaints   Review of Systems: All other systems were reviewed and found to be negative   Medications/Allergies Reviewed Medications/Allergies reviewed     TIA:    Hypokalemia:    Anxiety:    Hypertension:    Seizures:    Arm Surgery - Right: pinch nerve - 05-29-08, multiple other surgeries on same arm prior   Cholecystectomy:    Hysterectomy - Partial:    Hernia Repair:   Home Medications: Medication Instructions Status  ProAir HFA CFC free 90 mcg/inh aerosol 2 puffs inhaled every 4 hours as needed for wheezing or shortness of breath Active  simvastatin 10 mg oral tablet 1 tab(s) orally once a day (at bedtime) Active  traZODone 100 mg oral tablet  orally once a day Active  cyclobenzaprine 5 mg oral tablet  orally , As Needed Active  quinapril 20 mg oral tablet 1 tab(s) orally once a day Active  citalopram 10 mg oral tablet 1 tab(s) orally once a day Active   Lab Results:  Hepatic:  17-May-14 23:20   Bilirubin, Total 0.2  Alkaline Phosphatase 77  SGPT (ALT) 19  SGOT (AST) 19  Total Protein, Serum 6.6  Albumin, Serum 3.5  Routine Chem:  17-May-14 23:20   Creatinine (comp) 0.64  eGFR (African American) >60  eGFR (Non-African American) >60 (eGFR values <67m/min/1.73 m2 may be an indication of chronic kidney disease (CKD). Calculated eGFR is useful in patients with stable renal function. The eGFR calculation will not be reliable in acutely ill patients when serum creatinine is changing rapidly. It is  not useful in  patients on dialysis. The eGFR calculation may not be applicable to patients at the low and high extremes of body sizes, pregnant women, and vegetarians.)  Cholesterol, Serum 138  Triglycerides, Serum 129  HDL (INHOUSE)  32  VLDL Cholesterol Calculated 26  LDL Cholesterol Calculated 80 (Result(s) reported on 15 Apr 2013 at 08:55AM.)  Glucose, Serum 89  BUN 15  Sodium, Serum 143  Potassium, Serum 3.6  Chloride, Serum  111  CO2, Serum 26  Calcium (Total), Serum 8.9  Osmolality (calc) 285  Anion Gap  6  Cardiac:  17-May-14 23:20   Troponin I < 0.02 (0.00-0.05 0.05 ng/mL or less: NEGATIVE  Repeat testing in 3-6 hrs  if clinically indicated. >0.05 ng/mL: POTENTIAL  MYOCARDIAL INJURY. Repeat  testing in 3-6 hrs if  clinically indicated. NOTE: An increase or decrease  of 30% or more on serial  testing suggests a  clinically important change)  Routine UA:  17-May-14 23:20   Color (UA) Colorless  Clarity (UA) Clear  Glucose (UA) Negative  Bilirubin (UA) Negative  Ketones (UA) Negative  Specific Gravity (UA) 1.002  Blood (UA) Negative  pH (UA) 6.0  Protein (UA) Negative  Nitrite (UA) Negative  Leukocyte Esterase (UA) Negative (Result(s) reported on 15 Apr 2013 at 12:59AM.)  RBC (UA) <1 /HPF  WBC (UA) 1 /HPF  Bacteria (UA) NONE SEEN  Epithelial Cells (UA) NONE SEEN  Result(s) reported on 15 Apr 2013 at 12:59AM.  Routine Hem:  17-May-14 23:20   WBC (CBC)  11.4  RBC (CBC) 4.03  Hemoglobin (CBC) 12.6  Hematocrit (CBC) 36.6  Platelet Count (CBC) 204 (Result(s) reported on 14 Apr 2013 at 11:55PM.)  MCV 91  MCH 31.2  MCHC 34.4  RDW 13.1   EKG:  Interpretation EKG shows NSR with no significant ST or T wave changes   Radiology Results: Cardiology:    21-May-14 13:42, Echo Doppler  Echo Doppler   REASON FOR EXAM:      COMMENTS:       [facility]  - Apr 18 2013  1:42PM     RESULT: Echocardiogram  Report    Patient Name:   Ann GOFORTHDate of Exam: 04/18/2013  Medical Rec #:  [id number]          Custom1:  Date of Birth:  [birth year]       Height:       63.0 in  Patient Age:    89years         Weight:       127.0 lb  Patient Gender: F                BSA:          1.59 m??    Indications: Murmur  Sonographer:    JSherrie SportRDCS  Referring Phys: DWilfred Curtis M    Summary:   1. Essentially a normal study.   2. Left ventricular ejection fraction,  by visual estimation, is 55 to   60%.   3. Normal left ventricular size and wall thicknesses, with normal   systolic and diastolic function.   4. Normal right ventricular size and systolic function.   5. The left atrium is normal in size and structure.   6. Mild mitral valve regurgitation.   7. Normal RVSP  2D AND M-MODE MEASUREMENTS (normal ranges within parentheses):  Left Ventricle:          Normal  IVSd(2D):      0.97 cm (0.7-1.1)  LVPWd (2D):     1.15 cm (0.7-1.1) Aorta/LA:                  Normal  LVIDd (2D):     4.24 cm (3.4-5.7) Aortic Root (2D): 3.00 cm (2.4-3.7)  LVIDs (2D):     2.58 cm           Left Atrium (2D): 2.60 cm (1.9-4.0)  LV FS (2D):     39.2 %   (>25%)  LV EF (2D):     70.0 %   (>50%)                                    Right Ventricle:                                    RVd (2D):        8.76 cm  LV DIASTOLIC FUNCTION:  MV Peak E: 1.14 m/s E/e' Ratio: 31.60  MV Peak A: 0.78 m/sDecel Time: 310 msec  E/A Ratio: 1.47  SPECTRAL DOPPLER ANALYSIS (where applicable):  Mitral Valve:  MV P1/2 Time: 89.90 msec  MV Area, PHT: 2.45 cm??  Aortic Valve: AoV Max Vel: 1.41 m/s AoV Peak PG: 8.0 mmHg AoV Mean PG:  LVOT Vmax: 1.15 m/s LVOT VTI:  LVOT Diameter: 2.00 cm  AoV Area, Vmax: 2.56 cm?? AoV Area, VTI:  AoV Area, Vmn:  Tricuspid Valve and PA/RV Systolic Pressure: TR Max Velocity: 1.74 m/s RA   Pressure: 5 mmHg RVSP/PASP: 17.1 mmHg  Pulmonic Valve:  PV Max Velocity: 1.05 m/s PV Max PG: 4.4 mmHg PV Mean  PG:    PHYSICIAN INTERPRETATION:  Left Ventricle: Normal left ventricular size and wall thicknesses, with   normal systolic and diastolic function. The left ventricular internal   cavity size was normal. LV posterior wall thicknesswas normal. Global LV   systolic function was normal. Left ventricular ejection fraction, by   visual estimation, is 55 to 60%.  Right Ventricle: Normal right ventricular size, wall thickness, and     systolic function. The right ventricular size is normal. RV wall   thickness is normal. Global RV systolic function is normal.  Left Atrium: The left atrium is normal in size and structure. The left   atrium is normal in size.  Right Atrium: The right atrium is normal in size and structure. The right   atrium is normal in size.  Pericardium: There is no evidence of pericardial effusion.  Mitral Valve: Structurally normal mitral valve, with normal leaflet   excursion; without any evidence of mitral stenosis or significant   regurgitation. The mitral valve is normal in structure. Mild mitral valve   regurgitation is seen.  Tricuspid Valve: Structurally normal tricuspid valve, with normal leaflet   excursion. The tricuspid valve is  normal. Trivial tricuspid regurgitation   is visualized. The tricuspid regurgitant velocity is 1.74 m/s, and with   an assumed right atrial pressure of 5 mmHg, the estimated right     ventricular systolic pressure is normal at 17.1 mmHg.  Aortic Valve: The aortic valve is normal. The aortic valve is   structurally normal, with no evidence of sclerosis or stenosis. No   evidence of aortic valve regurgitation is seen.    [id number] Ida Rogue MD  Electronically signed by [id number] Ida Rogue MD  Signature Date/Time: 04/18/2013/6:24:03 PM    *** Final ***    IMPRESSION: .        Verified By: Minna Merritts, M.D., MD  CT:    19-May-14 11:23, CT Angiography Neck (Carotids)  CT Angiography Neck (Carotids)   REASON FOR EXAM:     carotid stenosis, TIA  COMMENTS:       PROCEDURE: CT  - CT ANGIOGRAPHY NECK W/CONTRAST  - Apr 16 2013 11:23AM     RESULT: Indication: Carotid stenosis    Comparisons: None    Technique: 100 ml of Isovue 370 was administered and the extracranial   carotid arteries bilaterally were scanned during the arterial phase from   the level of the superior portion of the frontal sinuses to the proximal   neck. These images were then transferred to the Siemens work station and   were subsequently reviewed utilizing 3-D reconstructions and MIP images.  Findings:     A three-vessel configuration of the aortic arch is identified with normal   ostium. The vertebral arteries are identified arising from the subclavian   arteries and entering the transverse foramen bilaterally at C6.     The carotid arteries are identified and are symmetric and unremarkable.   There is no evidence of aneurysm or carotid dissection bilaterally.     Right Carotid Artery: There is mild atherosclerotic plaque within the   right carotid bulb. There is no focal stenosis.    Left Carotid Artery: There is mild atherosclerotic plaque within the left   carotid bulb. There is no contrast within the proximal-most portion of   the left ICA with contrast seen distal to the area most consistent with     focal occlusion with distal reconstitution. Approximately 2 cm distal to   its origin, there is narrowing of the left ICA throughout its course.    The visualized portions of the brain are unremarkable.     There is degenerative disc disease most severe at C6-C7.    The lung apices are clear.    IMPRESSION:     1. Left Carotid Artery: There is mild atherosclerotic plaque within the   left carotid bulb. There is no contrast within the proximal-most portion   of the left ICA with contrast seen distal to the area most consistent   with focal occlusion with distal reconstitution. Approximately 2 cm     distal to its origin,  there is narrowing of the left ICA throughout its   course.    Dictation Site: 1        Verified By: Jennette Banker, M.D., MD    No Known Allergies:   Vital Signs/Nurse's Notes: **Vital Signs.:   21-May-14 15:55  Vital Signs Type Q 4hr  Temperature Temperature (F) 99.1  Celsius 37.2  Temperature Source oral  Pulse Pulse 76  Respirations Respirations 16  Systolic BP Systolic BP 361  Diastolic BP (mmHg) Diastolic BP (mmHg) 73  Mean BP  86  Pulse Ox % Pulse Ox % 98  Pulse Ox Activity Level  At rest  Oxygen Delivery Room Air/ 21 %    Impression 64 year old Caucasian female with history of hypertension, She is a chronic smoker, presenting after an episode of left-sided headache associated with fluttering of vision in the right eye, at that time associated with some speech problem. found to have severe carotid arterial disease, planned for carotid endarterectomy tomorrow.Cardiology was consulted for preop evaluation.   1) Preop evaluation: severe carotid disease,  CEA planned for tomorrow no recent sx concerning for angina, Very active at baseline with good activity level. No known CAD Normal  EKG, essentially a normal echo --No further testing needed -would continue aspirin, statin, BP medications   Electronic Signatures: Ida Rogue (MD)  (Signed 21-May-14 22:27)  Authored: General Aspect/Present Illness, History and Physical Exam, Review of System, Past Medical History, Home Medications, Labs, EKG , Radiology, Allergies, Vital Signs/Nurse's Notes, Impression/Plan   Last Updated: 21-May-14 22:27 by Ida Rogue (MD)

## 2015-03-21 NOTE — Op Note (Signed)
PATIENT NAME:  Ann Poole, Ann Poole MR#:  [id number] DATE OF BIRTH:  May 11, 1951  DATE OF PROCEDURE:  04/20/2013  PREOPERATIVE DIAGNOSES: 1.  Transient ischemic attack, left hemispheric.  2.  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  POSTOPERATIVE DIAGNOSES: 1.  Transient ischemic attack, left hemispheric.  2.  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   PROCEDURE PERFORMED:  1.  Left carotid endarterectomy with CorMatrix patch angioplasty.  2.  Repair of arterial defect with xenograft CorMatrix patch.   SURGEON:  Katha Cabal, M.D.   FIRST ASSISTANT:  Ms. Gillie Manners.   ANESTHESIA:  General by endotracheal intubation.   FLUIDS:  Per anesthesia record.   ESTIMATED BLOOD LOSS:  100 mL.   SPECIMEN:  Carotid plaque to pathology for permanent section.   INDICATIONS:  Ann Poole is a 64 year old woman who presents to the hospital with aphasia and some weakness of the right upper extremity.  MRI is negative for acute stroke.  Duplex ultrasound and CT angiography are discordant and she subsequently underwent contrast angiography which demonstrated a string sign at the origin of the internal with a small internal more distally, but the internal was patent.  She is therefore, given the symptomatic nature of her left lesion, undergoing carotid endarterectomy to prevent stroke.  The risks and benefits were reviewed.  All questions answered.  The patient agrees to proceed.   DESCRIPTION OF PROCEDURE:  The patient is taken to the operating room and placed in the supine position.  After adequate general anesthesia is induced and appropriate invasive monitors are placed, she is positioned supine with her neck extended slightly and rotated to the right.  The left neck and chest wall are prepped and draped in a sterile fashion.    A curvilinear incision is created along the anterior margin of sternocleidomastoid muscle and carried down  through the soft tissues, transecting the external jugular vein between 3-0 silk ties.  The platysma is also transected.  Sternocleidomastoid muscle is reflected posterolaterally and the common carotid artery is identified at the level of the omohyoid.  Dissection is then carried in a craniad direction exposing the bifurcation as well as the external carotid artery.  External carotid artery and superior thyroidal artery looped with Silastic vessel loops.  The internal carotid artery is dissected more proximally to a level above the visible plaque formation.  A total of 7000 units of heparin were given, allowed to circulate for 6 minutes, common carotid followed by the external, followed by the internal was clamped.  Arteriotomy is made, extended with Potts scissors.   Garrett coronary dilators starting with a 2 mm dilator is advanced without difficulty, however the 3 mm dilator is fairly tight and the Sundt shunt does not pass easily.  Therefore, the internal carotid artery is reclamped.  The arteriotomy is extended slightly and endarterectomy is performed under direct visualization for the common bulb and internal carotid artery.  The external carotid artery is treated with the eversion technique.   Interrupted 7-0 Prolene suture was used to tack the distal intimal edge within the internal carotid artery.  Interrupted 6-0 Prolene sutures are used to tack the proximal intimal edge in the common carotid artery.  The CorMatrix patch is rehydrated on the back table, beveled and then the CorMatrix patch is used to repair the arterial defect using running 6-0 Prolene in a four quadrant fashion.  Flushing maneuvers are performed.  The suture line is  completed and flow is then re-established first to the external carotid artery and then the internal carotid artery to prevent distal embolization.   Several bleeding points are controlled with interrupted 7-0 Prolene sutures.  The wound is then irrigated and inspected  for hemostasis and a small amount of Surgicel is placed posteriorly.   The platysma is then reapproximated using running 3-0 Vicryl and the skin is closed with 4-0 Monocryl subcuticular and then Dermabond is applied.  The patient tolerated the procedure well.  There were no immediate complications.  Sponge and needle counts are correct and she is awakened in the operating room, moving all extremities and actually answering questions appropriately.  She is taken to the recovery room in good condition.     ____________________________ Katha Cabal, MD ggs:ea D: 04/20/2013 19:00:37 ET T: 04/20/2013 23:20:03 ET JOB#: [id number]  cc: Katha Cabal, MD, <Dictator> Katha Cabal, MD  Katha Cabal MD ELECTRONICALLY SIGNED 04/24/2013 8:53

## 2015-03-21 NOTE — Op Note (Signed)
PATIENT NAME:  Ann Poole, Ann Poole MR#:  [id number] DATE OF BIRTH:  [birth year]  DATE OF PROCEDURE:  02/06/2013  PREOPERATIVE DIAGNOSIS: Recurrent condyloma of vagina and vulva.   POSTOPERATIVE DIAGNOSIS: Recurrent condyloma of vagina and vulva.   PROCEDURE PERFORMED: Laser vaporization.  SURGEON: Jacquelyne Balint, MD    ANESTHESIA: General.   COMPLICATIONS: None.   ESTIMATED BLOOD LOSS: 0.   INDICATION FOR SURGERY: [name] is a 64 year old patient who has a history of vulvar condyloma and now presented with recurrent disease on the left labia minora as well as the vaginal apex.   OPERATIVE REPORT: After adequate general anesthesia had been obtained, the patient was prepped and draped in lithotomy position. Acetic acid stain was performed, but no other abnormalities were noted.   The coated speculum was inserted into the vagina. The condyloma at the vaginal apex were visualized and vaporized using the CO2 laser at a power density of 22 in 3 layers following the [employer].   Then attention was directed towards the vulva. The two larger condyloma were excised using the laser beam, and the remainder of condylomas were vaporized again using the CO2 laser.       The patient tolerated the procedure well and was taken to the recovery room in stable condition. Pad, sponge, needle, and instrument counts were correct x 2.  ____________________________ Weber Cooks, MD bem:cb D: 02/06/2013 16:07:24 ET T: 02/06/2013 17:39:40 ET JOB#: [id number]  cc: Weber Cooks, MD, <Dictator> Weber Cooks MD ELECTRONICALLY SIGNED 02/13/2013 12:56

## 2015-03-21 NOTE — H&P (Signed)
PATIENT NAME:  Ann Poole, Ann Poole MR#:  [id number] DATE OF BIRTH:  October 30, 1951  DATE OF ADMISSION:  04/15/2013  PRIMARY CARE PHYSICIAN: [facility]   REFERRING PHYSICIAN: Charlesetta Ivory, MD   CHIEF COMPLAINT: Episodes of headache associated with fluttering of vision.   HISTORY OF PRESENT ILLNESS: The patient is a 64 year old Caucasian female with history of hypertension. She is a chronic smoker. She was in her usual state of health until this Tuesday evening. She had an episode of left-sided headache associated with fluttering of vision in the right eye, at that time associated with some speech problem. The patient indicates that she has the word in her mind but she is unable to verbalize it well and was slightly slurred. However, this episode was brief and subsided. She saw her primary care physician and she was told that possibly she had a TIA. She was scheduled to have carotid ultrasound and echocardiogram next week; however, she had an MRI done this Friday for which the patient does not know the results. The patient was notified that if she has a similar episode she has to go to the Emergency Department. Today, she came to the hospital because she developed a similar episode of left-sided headache associated with a fluttering vision in the right eye that lasted about 20 minutes and then subsided. This time did not associate with slurred speech. I reviewed the results of her MRI done on 05/16 that showed small vessel disease of the brain; however, there is indication of severe stenosis with possible thrombus in the left internal carotid artery. The patient was admitted for further evaluation and treatment.   REVIEW OF SYSTEMS:  CONSTITUTIONAL: Denies any fever. No chills. No fatigue.  EYES: Blurring of vision episode of mentioned above. This was brief and subsided. She has no visual problems right now. No double vision.  ENT: No hearing impairment. No sore throat. No dysphagia.   CARDIOVASCULAR: No chest pain. No shortness of breath. No syncope.  RESPIRATORY: She has no cough, no sputum production, no hemoptysis. She may have cough every now and then associated with her smoking.  GASTROINTESTINAL: No abdominal pain. No vomiting. No diarrhea.  GENITOURINARY: No dysuria. No frequency of urination.  MUSCULOSKELETAL: No joint pain or swelling. No muscular pain or swelling.  INTEGUMENTARY: No skin rash. No ulcers.  NEUROLOGY: No focal weakness other than what I mentioned above regarding the vision issue. No seizure activity recently. Headache was reported as above. No ataxia, no syncope.  PSYCHIATRY: She has a history of anxiety and depression, but she is not actively depressed now.  ENDOCRINE: No polyuria or polydipsia. No heat or cold intolerance.   PAST MEDICAL HISTORY: Systemic hypertension, anxiety disorder. She reports a history of questionable seizure that happened about 20 years ago, just one time. She described it as she fell to the floor as she was shaking her upper and lower extremities. However, the patient states that she was fully awake during that episode. This does not sound to be a true seizure.   PAST SURGICAL HISTORY: Recent laser vaporization of condyloma of the vulva and vagina. History of cholecystectomy and partial hysterectomy and history of ventral hernia repair.   FAMILY HISTORY: He father died from complications of pneumonia and he was suffering from Alzheimer's dementia. Her mother died at age of 64 from COPD complications.   SOCIAL HISTORY: She is divorced. She works as a Gaffer.   SOCIAL HABITS: Chronic smoker of 1/2 pack a day since age  of 14. She drinks alcohol only occasionally. No other drug abuse.   ADMISSION MEDICATIONS: Simvastatin 10 mg a day, trazodone 100 mg once a day, citalopram 10 mg a day, quinapril 20 mg a day, ProAir inhaler used only occasionally p.r.n.   ALLERGIES: No known drug allergies.   PHYSICAL  EXAMINATION: VITAL SIGNS: Blood pressure 166/86, subsequent blood pressure 129/71, 95/55, 131/69. Respiratory rate 18, pulse 72, temperature 98.4, oxygen saturation 96% on room air.  GENERAL APPEARANCE: Elderly female lying in bed in no acute distress.  HEAD AND NECK: No pallor. No icterus. No cyanosis.  EARS, NOSE, THROAT: Ear examination revealed normal hearing, no discharge, no lesions. Examination of the nose showed no ulcers, no discharge. Oropharyngeal examination revealed normal lips and tongue. She is edentulous and wearing her dentures. Eye examination revealed normal eyelids and conjunctivae. Pupils about 5 to 6 mm, round, equal and reactive to light.  NECK: Supple. Trachea at midline. No thyromegaly. No cervical lymphadenopathy. No masses.  HEART: Normal S1, S2. No S3, S4. No murmur. No gallop. No carotid bruits.  LUNGS: Normal breathing pattern without use of accessory muscles. No rales. No wheezing.  ABDOMEN: Soft without tenderness. No hepatosplenomegaly. No masses. No hernias. There is mid abdominal scar tissue from previous hernia repair.  MUSCULOSKELETAL: No joint swelling, no clubbing.   SKIN: No ulcers. No subcutaneous nodules.  NEUROLOGIC: Cranial nerves II through XII are intact. No focal motor deficit.  PSYCHIATRIC: The patient is alert and oriented x3. Mood and affect were normal.   LABORATORY FINDINGS: CT scan of the head done here at the Emergency Department was unremarkable, reported to be normal. However, her MRI of the head done on 05/16, that is two days ago, showed small vessel disease of the brain. There is severe stenosis with possible thrombosis of the left internal carotid artery. Her EKG showed normal sinus rhythm at a rate of 74 per minute, nonspecific ST abnormalities. Serum glucose 89, BUN 15, creatinine 0.6, sodium 143, potassium 3.6. Her liver function tests and liver transaminases were normal. Troponin less than 0.02. CBC showed white count of 11,000, hemoglobin  12, hematocrit 36, platelet count 204. Urinalysis was unremarkable. Her chest x-ray showed no acute cardiopulmonary abnormalities. There is evidence of hyperinflation consistent with emphysema.   ASSESSMENT: 1. Transient ischemic attack.  2. Severe stenosis of left internal carotid artery with possible thrombus formation.  3. Systemic hypertension.  4. Hypercholesterolemia.  5. Chronic smoker.   PLAN: We will admit the patient to the medical floor on telemetry monitoring with frequent neurologic examination. Start aspirin 325 mg a day. Obtain carotid ultrasound. Consult vascular surgery for further input about the carotid stenosis. I will increase her simvastatin from 10 mg to 20 mg; perhaps she needs a higher dose as well. The patient needs to stop smoking. I advised her and I offered nicotine patch but she declined the nicotine. Continue the rest of the home medications, monitor blood pressure, and to avoid excessive hypertension or excessive hypotension. For deep vein thrombosis prophylaxis, I placed her on subcutaneous heparin 5000 units q.8h.   Time spent in evaluating this patient took more than 55 minutes.     ____________________________ Clovis Pu. Lenore Manner, MD amd:aw D: 04/15/2013 04:06:43 ET T: 04/15/2013 06:31:17 ET JOB#: [id number]  cc: Clovis Pu. Lenore Manner, MD, <Dictator> Ellin Saba MD ELECTRONICALLY SIGNED 04/22/2013 23:32

## 2015-03-21 NOTE — Op Note (Signed)
PATIENT NAME:  Ann Poole, Ann Poole MR#:  [id number] DATE OF BIRTH:  1951/11/23  DATE OF PROCEDURE:  04/17/2013  PREOPERATIVE DIAGNOSES: Symptomatic carotid artery stenosis.   POSTOPERATIVE DIAGNOSIS: Symptomatic carotid artery stenosis.   PROCEDURES PERFORMED:  1.  Introduction catheter into left common carotid artery for cervical and cerebral views.  2.  Introduction catheter into right common carotid artery for cervical and cerebral views.  3.  Introduction catheter into right vertebral for cervical and cerebral views.   SURGEON: Hortencia Pilar, M.D.   SEDATION: Versed 3 mg plus fentanyl 100 mcg administered IV. Continuous ECG, pulse oximetry and cardiopulmonary monitoring was performed throughout the entire procedure by the interventional radiology nurse. Total sedation time was 1 hour.   ACCESS: 5-French sheath, right common femoral artery.   CONTRAST USED: Isovue 60 mL.   FLUORO TIME: 18.2 minutes.   INDICATIONS: Ms. Croston is a 64 year old woman who presented to the hospital with multiple episodes of aphasia and weakness of the right arm. These have resolved and work-up has not demonstrated any stroke. However, there was concern regarding an occlusion of the internal carotid artery versus a string sign. Duplex ultrasound was reported as a string sign while the CT angio appears to show an occlusion. Therefore, angiography is being performed to determine whether surgical intervention is appropriate. The risks and benefits were reviewed with the patient. All questions answered. The patient agrees to proceed.   DESCRIPTION OF PROCEDURE: The patient is taken to the special procedure suite, placed in the supine position. After adequate sedation is achieved, both groins are prepped and draped in sterile fashion. Ultrasound is placed in a sterile sleeve. Ultrasound is utilized secondary to lack of appropriate landmarks and to avoid vascular injury. Under direct ultrasound visualization, common  femoral artery is identified. It is echolucent and pulsatile indicating patency. Image is recorded for the permanent record. Then 1% lidocaine is infiltrated in the soft tissues and a micropuncture needle is used to access the anterior wall under direct ultrasound visualization after recording an image for the record.   Microwire followed micro sheath, J-wire followed by a 5-French sheath and 5-French pigtail catheter. Pigtail catheter is advanced up to the proximal ascending aorta and a LAO projection of the aortic arch is obtained. After review of the images, a JB-1 catheter is selected and the pigtail catheter is removed over a glide wire and the JB-1 catheter is advanced. Subsequently, the JB-1 and glide are used to access the right vertebral artery and injection by hand is performed demonstrating patency of the vertebral with wide patency of the basilar artery filling the cerebellar arteries. There does not appear to be any cross filling from the basilar and posterior circulation to the left middle cerebral distribution.   A series of catheters were then used to engage the left common carotid artery. Ultimately, a Rosita Fire 1 was successful at engaging the artery and selective injections of the common and intracranial left carotid were obtained in AP, lateral, and oblique views. After reviewing these images, it was determined that it is necessary to image the right side as there is not clear filling of the middle cerebral artery on the left by a left injection. Therefore, the Rosita Fire was exchanged for JB-1.  JB-1 was advanced into the right common carotid and cervical and cerebral views of the right were obtained. After review of all these images, the catheter was removed over a wire, oblique view of the right groin was obtained, and a Perclose device deployed  successfully. There were no immediate complications.   INTERPRETATION: The arch demonstrates patency. There are diffuse atherosclerotic changes, but  there are no hemodynamically significant stenoses or large ulcerations visualized. The arch itself appears to be a type II arch, almost a type III, and it is nearly a bovine arch with the origin of the left common carotid emanating from the aorta, but essentially near the same location as the innominate. The origins to all the great vessels were widely patent.   Injection of the right vertebral demonstrates the vertebral is the dominant vertebral. It is widely patent. There are no ostial lesions noted and there is rapid filling of the basilar artery in the posterior circulation. However, there is no cross filling of the posterior into the left middle cerebral distribution.   Imaging of the left common carotid and left carotid circulation demonstrates the common carotid artery and the external carotid artery are widely patent. The internal carotid artery has a very fine string sign at its origin for a distance of approximately 1 cm.  There is then moderate poststenotic dilatation and a very small 1 to 1.5 mm artery which is patent up to the siphon. Injection on the left side does not fill the middle cerebral distribution. Because it is uncertain as to whether this is secondary to cross filling from the right or to a stenosis and/or occlusion in the siphon, I elected to do a right common carotid injection to obtain cervical and cerebral views. This did show the right common carotid artery demonstrates moderate plaque formation at the bifurcation, perhaps 50%, but the entire cerebral circulation fills from the right injection. That is both middle cerebrals and both anterior cerebrals fill.  Given the fact that the pressure from the right side is equal to systemic pressure, since there are no identifiable lesions, it is more than likely that the nonfilling of the left side from the left injection is secondary to the better pressure coming from the right. Therefore, it is my belief that this left side does  represent a patent internal carotid artery, although small, and it does therefore require endarterectomy for repair as it is symptomatic. Risks and benefits will be reviewed with the patient. ____________________________ Katha Cabal, MD ggs:sb D: 04/20/2013 10:32:25 ET T: 04/20/2013 11:41:45 ET JOB#: [id number]  cc: Katha Cabal, MD, <Dictator> Katha Cabal MD ELECTRONICALLY SIGNED 04/24/2013 8:53

## 2015-03-21 NOTE — Consult Note (Signed)
CHIEF COMPLAINT and HISTORY:  Subjective/Chief Complaint headache, confusion, TIA   History of Present Illness Patient admitted with second episode this week of left sided headache, aphasia, confusion and difficulties with coordination.  Long term smoker.  Otherwise reasonably healthy.  Both episodes lasted short periods of time.  MRI between episodes showed no stroke.  Had an [place] suggesting 75-95% left ICA stenosis.  I ordered a CT angiogram and have reviewed this and the official read is of a left carotid occlusion.  It is difficult for me to tell if this is an occlusion or a string sign from a very high grade stenosis.   PAST MEDICAL/SURGICAL HISTORY:  Past Medical History:   TIA:    Hypokalemia:    Anxiety:    Hypertension:    Seizures:    Arm Surgery - Right:    Cholecystectomy:    Hysterectomy - Partial:    Hernia Repair:   ALLERGIES:  Allergies:  No Known Allergies:   HOME MEDICATIONS:  Home Medications: Medication Instructions Status  ProAir HFA CFC free 90 mcg/inh aerosol 2 puffs inhaled every 4 hours as needed for wheezing or shortness of breath Active  simvastatin 10 mg oral tablet 1 tab(s) orally once a day (at bedtime) Active  traZODone 100 mg oral tablet  orally once a day Active  cyclobenzaprine 5 mg oral tablet  orally , As Needed Active  quinapril 20 mg oral tablet 1 tab(s) orally once a day Active  citalopram 10 mg oral tablet 1 tab(s) orally once a day Active   Family and Social History:  Family History Non-Contributory   Social History positive  tobacco, positive ETOH   + Tobacco Current (within 1 year)   Place of Living Home   Review of Systems:  Subjective/Chief Complaint neuro event as per HPI   Fever/Chills No   Cough No   Sputum No   Abdominal Pain No   Diarrhea No   Constipation No   Nausea/Vomiting No   SOB/DOE No   Chest Pain No   Telemetry Reviewed NSR   Dysuria No   Tolerating PT Yes   Tolerating Diet Yes    Medications/Allergies Reviewed Medications/Allergies reviewed   Physical Exam:  GEN well developed, well nourished   HEENT hearing intact to voice, moist oral mucosa   NECK No masses  trachea midline   RESP normal resp effort  no use of accessory muscles   CARD regular rate  positive carotid bruits  no JVD  bruit on the left   ABD denies tenderness  normal BS   LYMPH negative neck, negative axillae   EXTR negative cyanosis/clubbing, negative edema   SKIN No ulcers, skin turgor good   NEURO cranial nerves intact, motor/sensory function intact   PSYCH alert, A+O to time, place, person   LABS:  Laboratory Results: Hepatic:    17-May-14 23:20, Comprehensive Metabolic Panel  Bilirubin, Total 0.2  Alkaline Phosphatase 77  SGPT (ALT) 19  SGOT (AST) 19  Total Protein, Serum 6.6  Albumin, Serum 3.5  Cardiology:    17-May-14 22:54, ED ECG  Ventricular Rate 74  Atrial Rate 74  P-R Interval 146  QRS Duration 74  QT 396  QTc 439  P Axis 65  R Axis 78  T Axis 76  ECG interpretation   Normal sinus rhythm  Nonspecific ST abnormality  Abnormal ECG  When compared with ECG of 01-Apr-2013 06:10,  ST no longer depressed in Inferior leads  T wave inversion no longer  evident in Inferior leads  ----------unconfirmed----------  Confirmed byOVERREAD, NOT ([id number]), editor PEARSON, BARBARA ([id number]) on 04/16/2013 10:23:17 AM  ED ECG   Routine Chem:    17-May-14 23:20, Comprehensive Metabolic Panel  Glucose, Serum 89  BUN 15  Creatinine (comp) 0.64  Sodium, Serum 143  Potassium, Serum 3.6  Chloride, Serum 111  CO2, Serum 26  Calcium (Total), Serum 8.9  Osmolality (calc) 285  eGFR (African American) >60  eGFR (Non-African American) >60  eGFR values <25m/min/1.73 m2 may be an indication of chronic  kidney disease (CKD).  Calculated eGFR is useful in patients with stable renal function.  The eGFR calculation will not be reliable in acutely ill patients  when serum creatinine is  changing rapidly. It is not useful in   patients on dialysis. The eGFR calculation may not be applicable  to patients at the low and high extremes of body sizes, pregnant  women, and vegetarians.  Anion Gap 6    17-May-14 23:20, Lipid Profile ([facility])  Cholesterol, Serum 138  Triglycerides, Serum 129  HDL (INHOUSE) 32  VLDL Cholesterol Calculated 26  LDL Cholesterol Calculated 80  Result(s) reported on 15 Apr 2013 at 08:55AM.  Cardiac:    17-May-14 23:20, Troponin I  Troponin I < 0.02  0.00-0.05  0.05 ng/mL or less: NEGATIVE   Repeat testing in 3-6 hrs   if clinically indicated.  >0.05 ng/mL: POTENTIAL   MYOCARDIAL INJURY. Repeat   testing in 3-6 hrs if   clinically indicated.  NOTE: An increase or decrease   of 30% or more on serial   testing suggests a   clinically important change  Routine UA:    17-May-14 23:20, Urinalysis  Color (UA)   Colorless  Clarity (UA) Clear  Glucose (UA) Negative  Bilirubin (UA) Negative  Ketones (UA) Negative  Specific Gravity (UA) 1.002  Blood (UA) Negative  pH (UA) 6.0  Protein (UA) Negative  Nitrite (UA) Negative  Leukocyte Esterase (UA) Negative  Result(s) reported on 15 Apr 2013 at 12:59AM.  RBC (UA) <1 /HPF  WBC (UA) 1 /HPF  Bacteria (UA)   NONE SEEN  Epithelial Cells (UA)   NONE SEEN   Result(s) reported on 15 Apr 2013 at 12:59AM.  Routine Hem:    17-May-14 23:20, Hemogram, Platelet Count  WBC (CBC) 11.4  RBC (CBC) 4.03  Hemoglobin (CBC) 12.6  Hematocrit (CBC) 36.6  Platelet Count (CBC) 204  Result(s) reported on 14 Apr 2013 at 11:55PM.  MCV 91  MCH 31.2  MCHC 34.4  RDW 13.1   RADIOLOGY:  Radiology Results: XRay:    04-May-14 07:03, Chest PA and Lateral  Chest PA and Lateral  REASON FOR EXAM:    Chest Pain  COMMENTS:       PROCEDURE: DXR - DXR CHEST PA (OR AP) AND LATERAL  - Apr 01 2013  7:03AM     RESULT: The lungs are hyperinflated with hemidiaphragm flattening. There   is no focal infiltrate. The cardiac  silhouette is normal in size. The   mediastinum is normal in width. There is no pleural effusion. The bony   thorax exhibits no acute abnormality.    IMPRESSION:  There is hyperinflation consistent with COPD or reactive   airway disease. There is no evidence of CHFnor pneumonia.     Dictation Site: 1    Verified By: DAVID A. JMartinique M.D., MD    18-May-14 00:44, Chest PA and Lateral  Chest PA and Lateral  REASON FOR EXAM:  cough, eye twitching and difficulty with speech,   previous pneumonia  COMMENTS:       PROCEDURE: DXR - DXR CHEST PA (OR AP) AND LATERAL  - Apr 15 2013 12:44AM     RESULT: Comparison made to the study of Apr 01, 2013.    The lungs remain hyperinflated. There is no focal infiltrate. The cardiac   silhouette is normal in size. The pulmonary vascularity is not engorged.   The perihilar lung markings are mildly prominent.    IMPRESSION:  The findings are consistent with COPD and likely   superimposed acute bronchitis. There is no focal pneumonia. There is no   significant change since a study of earlier this month. A followup films     following any therapy is recommended to assure clearing. Chest CT   scanning may be ultimately indicated.     Dictation Site: 1        Verified By: DAVID A. Martinique, M.D., MD  [place]:    18-May-14 05:00, US Carotid Doppler Bilateral  US Carotid Doppler Bilateral  REASON FOR EXAM:    TIA and left carotid stenosis  COMMENTS:   LMP: Post-Menopausal    PROCEDURE: [place]  - US CAROTID DOPPLER BILATERAL  - Apr 15 2013  5:00AM     RESULT: Grayscale and color flow Doppler techniques or employed to   evaluate the cervicalcarotid systems.    On the right there is a small moderate amount of soft plaque throughout   the carotid system. The waveform patterns and color flow images do not   suggest significant turbulence. The peak internal carotid systolic   velocity on the right measures 91 cm/sec and the peak common carotid   velocity  measured 125 cm/sec corresponding to a normal ratio of 0.7.    On the left there is soft plaque in the distal CCA and there is soft and     calcified plaque at the bulb and proximal ICA. There is turbulence in the   proximal ICA and visually the degree of stenosis is between 75 and 95% in   the proximal ICA. Peak internal carotid systolic velocity is accelerated   to 328 cm/sec and the peak common carotid velocity measures 76 cm/sec  corresponding to an abnormal ratio of 4.3.    The vertebral arteries are normal in flow direction bilaterally.    IMPRESSION:    1. Calcified and soft plaque in the proximal ICA is consistent with a   degree of stenosis of 75 to 95%.  2. On the right only a small to amount of soft plaque is noted throughout   the carotid system and normal velocities are demonstrated.  3. The vertebral arteries are normal in flow direction bilaterally.  A preliminary report was sent to the emergency department at the   conclusion of the study.     Dictation Site: 1        Verified By: DAVID A. Martinique, M.D., MD  LabUnknown:    04-May-14 07:03, Chest PA and Lateral  PACS Image    16-May-14 13:50, MRI Brain Without Contrast  PACS Image    17-May-14 23:45, CT Head Without Contrast  PACS Image    18-May-14 00:44, Chest PA and Lateral  PACS Image    18-May-14 05:00, US Carotid Doppler Bilateral  PACS Image    19-May-14 11:23, CT Angiography Neck (Carotids)  PACS Image  MRI:    16-May-14 13:50, MRI Brain Without Contrast  MRI Brain Without Contrast  REASON FOR EXAM:    hx hypertension smoker questionable CVA left sided HA   with recurrent Rt visu...  COMMENTS:       PROCEDURE: MR  - MR BRAIN WO CONTRAST  - Apr 13 2013  1:50PM     RESULT: History: Left-sided headache    Technique: Multiplanar, multisequence MRI of the brain was obtained   without IV contrast.     Comparison:  None    Findings:   There is no acute infarct. There is no hemorrhage. There is no  pathologic   extra-axial fluid collection. There is mild periventricular and deep   white matter T2 and FLAIR hyperintensity likely secondary to   microangiopathy. There is no hydrocephalus. The ventricles are normal for   age. The basal cisterns are patent. There are no abnormal extra-axial   fluid collections.     The visualized paranasal sinuses and mastoid sinuses are clear. The skull   base and calvarium demonstrate normal signal. There is severe stenosis   with possible thrombosis of the left intracranial ICA.    IMPRESSION:     Severe stenosis with possible thrombosis of theleft intracranial ICA.  No acute infarct.    Dictation Site: 1        Verified By: Jennette Banker, M.D., MD  CT:    17-May-14 23:45, CT Head Without Contrast  CT Head Without Contrast  REASON FOR EXAM:    headache  COMMENTS:       PROCEDURE: CT  - CT HEAD WITHOUT CONTRAST  - Apr 14 2013 11:45PM     RESULT: Axial noncontrast CT scanning was performed through the brain   with reconstructions at 5 mm intervals and slice thicknesses.    The ventricles are normal in size and position. There is no intracranial   hemorrhage nor intracranial mass effect. There is no evidence of an   evolving ischemic infarction. The cerebellum and brainstem are normal in   density. At bone window settings the observed portions of the paranasal   sinuses and mastoid air cells are clear. There is no evidence of an acute   skull fracture.  IMPRESSION:  There is no acute intracranial abnormality.    A preliminary report was sent to the emergency department at the   conclusion of the study.     Dictation Site: 1        Verified By: DAVID A. Martinique, M.D., MD    19-May-14 11:23, CT Angiography Neck (Carotids)  CT Angiography Neck (Carotids)  REASON FOR EXAM:    carotid stenosis, TIA  COMMENTS:       PROCEDURE: CT  - CT ANGIOGRAPHY NECK W/CONTRAST  - Apr 16 2013 11:23AM     RESULT: Indication: Carotid  stenosis    Comparisons: None    Technique: 100 ml of Isovue 370 was administered and the extracranial   carotid arteries bilaterally were scanned during the arterial phase from   the level of the superior portion of the frontal sinuses to the proximal   neck. These images were then transferred to the Siemens work station and   were subsequently reviewed utilizing 3-D reconstructions and MIP images.  Findings:     A three-vessel configuration of the aortic arch is identified with normal   ostium. The vertebral arteries are identified arising from the subclavian   arteries and entering the transverse foramen bilaterally at C6.     The carotid arteries are identified and are symmetric and unremarkable.  There is no evidence of aneurysm or carotid dissection bilaterally.     Right Carotid Artery: There is mild atherosclerotic plaque within the   right carotid bulb. There is no focal stenosis.    Left Carotid Artery: There is mild atherosclerotic plaque within the left   carotid bulb. There is no contrast within the proximal-most portion of   the left ICA with contrast seen distal to the area most consistent with     focal occlusion with distal reconstitution. Approximately 2 cm distal to   its origin, there is narrowing of the left ICA throughout its course.    The visualized portions of the brain are unremarkable.     There is degenerative disc disease most severe at C6-C7.    The lung apices are clear.    IMPRESSION:     1. Left Carotid Artery: There is mild atherosclerotic plaque within the   left carotid bulb. There is no contrast within the proximal-most portion   of the left ICA with contrast seen distal to the area most consistent   with focal occlusion with distal reconstitution. Approximately 2 cm     distal to its origin, there is narrowing of the left ICA throughout its   course.    Dictation Site: 1        Verified By: Jennette Banker, M.D., MD  [facility]:    08-Jul-13 11:38, Screening Digital Mammogram  Screening Digital Mammogram  REASON FOR EXAM:    SCR MAMMO NO ORDER  COMMENTS:       PROCEDURE: MAM - MAM DGTL SCRN MAM NO ORDER W/CAD  - Jun 05 2012 11:38AM     RESULT: No dominant masses or pathologic clustered calcifications   demonstrated.  Benign calcifications are present and are stable. CAD   evaluation is nonfocal. The breasts are moderately dense.    IMPRESSION:    1. Stable, benign exam. Yearly follow-up mammogram suggested.     BI-RADS: Category 2 - Benign Finding.    A NEGATIVE MAMMOGRAM REPORT DOES NOT PRECLUDE BIOPSY OR OTHER EVALUATION   OF A CLINICALLY PALPABLE OR OTHERWISE SUSPICIOUS MASS OR LESION. BREAST   CANCER MAY NOT BE DETECTED BY MAMMOGRAPHY IN UP TO 10% OF CASES.      Thank you for the opportunity to contribute to the care of your patient.           Verified By: Osa Craver, M.D., MD   ASSESSMENT AND PLAN:  Assessment/Admission Diagnosis left carotid stenosis or occlusion with symptoms   Plan Had an [place] suggesting 75-95% left ICA stenosis.  I ordered a CT angiogram and have reviewed this and the official read is of a left carotid occlusion.  It is difficult for me to tell if this is an occlusion or a string sign from a very high grade stenosis.This is a very important differentiation as a stenosis would be treated surgically but an occlusion would be treated medically.  With differing results on two non-invasive tests, will need a Catheter based angiogram for further evaluation.  Risks and benefits discussed with patient and he is agreeable to proceed.  Will try to fit on tomorrow if schedule not already full.    level 4   Electronic Signatures: Algernon Huxley (MD)  (Signed 19-May-14 15:50)  Authored: Chief Complaint and History, PAST MEDICAL/SURGICAL HISTORY, ALLERGIES, HOME MEDICATIONS, Family and Social History, Review of Systems, Physical Exam, LABS, RADIOLOGY, Assessment and  Plan   Last Updated:  19-May-14 15:50 by Algernon Huxley (MD)

## 2015-04-08 ENCOUNTER — Emergency Department: Payer: Medicare Other

## 2015-04-08 ENCOUNTER — Emergency Department
Admission: EM | Admit: 2015-04-08 | Discharge: 2015-04-09 | Disposition: A | Discharge status: Home / Self Care | Payer: Medicare Other | Attending: Emergency Medicine | Admitting: Emergency Medicine

## 2015-04-08 ENCOUNTER — Other Ambulatory Visit: Payer: Self-pay

## 2015-04-08 ENCOUNTER — Encounter: Payer: Self-pay | Admitting: Emergency Medicine

## 2015-04-08 DIAGNOSIS — I10 Essential (primary) hypertension: Secondary | ICD-10-CM | POA: Diagnosis not present

## 2015-04-08 DIAGNOSIS — R42 Dizziness and giddiness: Secondary | ICD-10-CM | POA: Diagnosis not present

## 2015-04-08 DIAGNOSIS — R55 Syncope and collapse: Secondary | ICD-10-CM | POA: Diagnosis present

## 2015-04-08 DIAGNOSIS — Z720 Tobacco use: Secondary | ICD-10-CM | POA: Insufficient documentation

## 2015-04-08 DIAGNOSIS — Z79899 Other long term (current) drug therapy: Secondary | ICD-10-CM | POA: Insufficient documentation

## 2015-04-08 HISTORY — DX: Major depressive disorder, single episode, unspecified: F32.9

## 2015-04-08 HISTORY — DX: Essential (primary) hypertension: I10

## 2015-04-08 LAB — CBC
HCT: 40.1 % (ref 35.0–47.0)
Hemoglobin: 13.5 g/dL (ref 12.0–16.0)
MCH: 31.1 pg (ref 26.0–34.0)
MCHC: 33.6 g/dL (ref 32.0–36.0)
MCV: 92.5 fL (ref 80.0–100.0)
Platelets: 185 K/uL (ref 150–440)
RBC: 4.34 MIL/uL (ref 3.80–5.20)
RDW: 12.7 % (ref 11.5–14.5)
WBC: 8.4 K/uL (ref 3.6–11.0)

## 2015-04-08 LAB — COMPREHENSIVE METABOLIC PANEL
ALT: 15 U/L (ref 14–54)
AST: 21 U/L (ref 15–41)
Albumin: 4.1 g/dL (ref 3.5–5.0)
Alkaline Phosphatase: 79 U/L (ref 38–126)
Anion gap: 8 (ref 5–15)
BUN: 11 mg/dL (ref 6–20)
CO2: 24 mmol/L (ref 22–32)
Calcium: 9.4 mg/dL (ref 8.9–10.3)
Chloride: 106 mmol/L (ref 101–111)
Creatinine, Ser: 0.64 mg/dL (ref 0.44–1.00)
GFR calc Af Amer: >60 mL/min (ref >60)
GFR calc non Af Amer: >60 mL/min (ref >60)
Glucose, Bld: 108 mg/dL — ABNORMAL HIGH (ref 65–99)
POTASSIUM: 3.7 mmol/L (ref 3.5–5.1)
SODIUM: 138 mmol/L (ref 135–145)
TOTAL PROTEIN: 6.8 g/dL (ref 6.5–8.1)
Total Bilirubin: 0.4 mg/dL (ref 0.3–1.2)

## 2015-04-08 LAB — TROPONIN I: Troponin I: <0.03 ng/mL

## 2015-04-08 NOTE — ED Notes (Signed)
Pt presents to [facility] ED with c/o dizziness, syncopal episode, HTN-210/120 prior to arrival, onset yesterday. States she passed out yesterday then back to normal but noticed that her BP has been high. Pt states dizziness began again today with slight headache(denies headache now) but denies syncope. Pt reports she was on BP meds about 1.5 yr ago but her PCP (Dr. Levander Campion) stopped meds because her BP was running normal. Pt reports that she went to her PCP yesterday and did general check up and labs work, does not know labs results yet. Negative for stroke symptoms. Pt alerts and oriented x4 at this time CBG-133 per EMS, airway intact.

## 2015-04-08 NOTE — ED Provider Notes (Signed)
[facility] Emergency Department Provider Note  ____________________________________________  Time seen: 11:35 PM  I have reviewed the triage vital signs and the nursing notes.   HISTORY  Chief Complaint Near Syncope and Hypertension      HPI Ann Poole is a 64 y.o. female presents with history of dizziness today and near syncopal episode yesterday. Patient states that dizziness has been occurring now for the past few months. Patient states she has a history of hypertension however "weaned herself off her medications" a year and a half ago. Of note BP on EMS arrival was 210/120 per palpation. BP in emergency department 185/86. Patient denies any weakness or numbness no change in vision.     Past Medical History  Diagnosis Date  . Hypertension   . Depression 2013    There are no active problems to display for this patient.   Past Surgical History  Procedure Laterality Date  . Hernia repair    . Abdominal hysterectomy    . Artery mass Left     Current Outpatient Rx  Name  Route  Sig  Dispense  Refill  . primidone (MYSOLINE) 50 MG tablet   Oral   Take 50 mg by mouth 2 (two) times daily.          . sertraline (ZOLOFT) 50 MG tablet   Oral   Take 50 mg by mouth daily.           Allergies Review of patient's allergies indicates no known allergies.  History reviewed. No pertinent family history.  Social History History  Substance Use Topics  . Smoking status: Current Every Day Smoker -- 0.50 packs/day    Types: Cigarettes  . Smokeless tobacco: Former Systems developer  . Alcohol Use: Yes     Comment: occ    Review of Systems  Constitutional: Negative for fever. Eyes: Negative for visual changes. ENT: Negative for sore throat. Cardiovascular: Negative for chest pain. Respiratory: Negative for shortness of breath. Gastrointestinal: Negative for abdominal pain, vomiting and diarrhea. Genitourinary: Negative for  dysuria. Musculoskeletal: Negative for back pain. Skin: Negative for rash. Neurological: Negative for headaches, focal weakness or numbness. Positive for dizziness   10-point ROS otherwise negative.  ____________________________________________   PHYSICAL EXAM:  VITAL SIGNS: ED Triage Vitals  Enc Vitals Group     BP 04/08/15 2136 185/86 mmHg     Pulse Rate 04/08/15 2136 70     Resp 04/08/15 2136 19     Temp 04/08/15 2136 98.6 F (37 C)     Temp Source 04/08/15 2136 Oral     SpO2 04/08/15 2136 96 %     Weight 04/08/15 2148 114 lb (51.71 kg)     Height 04/08/15 2148 5\' 2"  (1.575 m)     Head Cir --      Peak Flow --      Pain Score 04/08/15 2137 0     Pain Loc --      Pain Edu? --      Excl. in [place]? --      Constitutional: Alert and oriented. Well appearing and in no distress. Eyes: Conjunctivae are normal. PERRL. Normal extraocular movements. ENT   Head: Normocephalic and atraumatic.   Nose: No congestion/rhinnorhea.   Mouth/Throat: Mucous membranes are moist.   Neck: No stridor. Hematological/Lymphatic/Immunilogical: No cervical lymphadenopathy. Cardiovascular: Normal rate, regular rhythm. Normal and symmetric distal pulses are present in all extremities. No murmurs, rubs, or gallops. Respiratory: Normal respiratory effort without tachypnea nor retractions.  Breath sounds are clear and equal bilaterally. No wheezes/rales/rhonchi. Gastrointestinal: Soft and nontender. No distention. There is no CVA tenderness. Genitourinary: deferred Musculoskeletal: Nontender with normal range of motion in all extremities. No joint effusions.  No lower extremity tenderness nor edema. Neurologic:  Normal speech and language. No gross focal neurologic deficits are appreciated. Speech is normal.  Skin:  Skin is warm, dry and intact. No rash noted. Psychiatric: Mood and affect are normal. Speech and behavior are normal. Patient exhibits appropriate insight and  judgment.  ____________________________________________    LABS (pertinent positives/negatives)  Labs Reviewed  COMPREHENSIVE METABOLIC PANEL - Abnormal; Notable for the following:    Glucose, Bld 108 (*)    All other components within normal limits  CBC  TROPONIN I     ____________________________________________   EKG   Date: 04/08/2015  Rate: 68  Rhythm: normal sinus rhythm  QRS Axis: normal  Intervals: normal  ST/T Wave abnormalities: normal  Conduction Disutrbances: none  Narrative Interpretation: unremarkable      ____________________________________________    RADIOLOGY  negative CT scan of the head.  ____________________________________________     ____________________________________________   INITIAL IMPRESSION / ASSESSMENT AND PLAN / ED COURSE  Pertinent labs & imaging results that were available during my care of the patient were reviewed by me and considered in my medical decision making (see chart for details).  History of physical consistent with uncontrolled hypertension CT scan of the head negative. Patient will be referred to PMD for further evaluation of dizziness. Regarding hypertension patient prescribed hydrochlorothiazide and advised to take blood pressure twice daily and an document to taken to PMD at next visit.  ____________________________________________   FINAL CLINICAL IMPRESSION(S) / ED DIAGNOSES  Final diagnoses:  Hypertension, uncontrolled  Dizzinesses      Gregor Hams, MD 04/09/15 [phone]

## 2015-04-09 DIAGNOSIS — R42 Dizziness and giddiness: Secondary | ICD-10-CM | POA: Diagnosis not present

## 2015-04-09 MED ORDER — HYDROCHLOROTHIAZIDE 25 MG PO TABS: 25.0000 mg | ORAL_TABLET | Freq: Every day | ORAL | Status: AC

## 2015-04-09 MED ORDER — HYDROCHLOROTHIAZIDE 25 MG PO TABS
25.0000 mg | ORAL_TABLET | Freq: Every day | ORAL | Status: DC
  Administered 2015-04-09: 25 mg via ORAL

## 2015-04-09 MED ORDER — HYDROCHLOROTHIAZIDE 25 MG PO TABS
ORAL_TABLET | ORAL | Status: AC
  Administered 2015-04-09: 25 mg via ORAL
  Filled 2015-04-09: qty 1

## 2015-04-09 NOTE — Discharge Instructions (Signed)
Hypertension °Hypertension, commonly called high blood pressure, is when the force of blood pumping through your arteries is too strong. Your arteries are the blood vessels that carry blood from your heart throughout your body. A blood pressure reading consists of a higher number over a lower number, such as 110/72. The higher number (systolic) is the pressure inside your arteries when your heart pumps. The lower number (diastolic) is the pressure inside your arteries when your heart relaxes. Ideally you want your blood pressure below 120/80. °Hypertension forces your heart to work harder to pump blood. Your arteries may become narrow or stiff. Having hypertension puts you at risk for heart disease, stroke, and other problems.  °RISK FACTORS °Some risk factors for high blood pressure are controllable. Others are not.  °Risk factors you cannot control include:  °· Race. You may be at higher risk if you are African American. °· Age. Risk increases with age. °· Gender. Men are at higher risk than women before age 45 years. After age 65, women are at higher risk than men. °Risk factors you can control include: °· Not getting enough exercise or physical activity. °· Being overweight. °· Getting too much fat, sugar, calories, or salt in your diet. °· Drinking too much alcohol. °SIGNS AND SYMPTOMS °Hypertension does not usually cause signs or symptoms. Extremely high blood pressure (hypertensive crisis) may cause headache, anxiety, shortness of breath, and nosebleed. °DIAGNOSIS  °To check if you have hypertension, your health care provider will measure your blood pressure while you are seated, with your arm held at the level of your heart. It should be measured at least twice using the same arm. Certain conditions can cause a difference in blood pressure between your right and left arms. A blood pressure reading that is higher than normal on one occasion does not mean that you need treatment. If one blood pressure reading  is high, ask your health care provider about having it checked again. °TREATMENT  °Treating high blood pressure includes making lifestyle changes and possibly taking medicine. Living a healthy lifestyle can help lower high blood pressure. You may need to change some of your habits. °Lifestyle changes may include: °· Following the DASH diet. This diet is high in fruits, vegetables, and whole grains. It is low in salt, red meat, and added sugars. °· Getting at least 2½ hours of brisk physical activity every week. °· Losing weight if necessary. °· Not smoking. °· Limiting alcoholic beverages. °· Learning ways to reduce stress. ° If lifestyle changes are not enough to get your blood pressure under control, your health care provider may prescribe medicine. You may need to take more than one. Work closely with your health care provider to understand the risks and benefits. °HOME CARE INSTRUCTIONS °· Have your blood pressure rechecked as directed by your health care provider.   °· Take medicines only as directed by your health care provider. Follow the directions carefully. Blood pressure medicines must be taken as prescribed. The medicine does not work as well when you skip doses. Skipping doses also puts you at risk for problems.   °· Do not smoke.   °· Monitor your blood pressure at home as directed by your health care provider.  °SEEK MEDICAL CARE IF:  °· You think you are having a reaction to medicines taken. °· You have recurrent headaches or feel dizzy. °· You have swelling in your ankles. °· You have trouble with your vision. °SEEK IMMEDIATE MEDICAL CARE IF: °· You develop a severe headache or confusion. °·   You have unusual weakness, numbness, or feel faint.  You have severe chest or abdominal pain.  You vomit repeatedly.  You have trouble breathing. MAKE SURE YOU:   Understand these instructions.  Will watch your condition.  Will get help right away if you are not doing well or get worse. Document  Released: 11/15/2005 Document Revised: 04/01/2014 Document Reviewed: 09/07/2013 [facility] Patient Information 2015 [place], [place]. This information is not intended to replace advice given to you by your health care provider. Make sure you discuss any questions you have with your health care provider.  Dizziness Dizziness is a common problem. It is a feeling of unsteadiness or light-headedness. You may feel like you are about to faint. Dizziness can lead to injury if you stumble or fall. A person of any age group can suffer from dizziness, but dizziness is more common in older adults. CAUSES  Dizziness can be caused by many different things, including:  Middle ear problems.  Standing for too long.  Infections.  An allergic reaction.  Aging.  An emotional response to something, such as the sight of blood.  Side effects of medicines.  Tiredness.  Problems with circulation or blood pressure.  Excessive use of alcohol or medicines, or illegal drug use.  Breathing too fast (hyperventilation).  An irregular heart rhythm (arrhythmia).  A low red blood cell count (anemia).  Pregnancy.  Vomiting, diarrhea, fever, or other illnesses that cause body fluid loss (dehydration).  Diseases or conditions such as Parkinson's disease, high blood pressure (hypertension), diabetes, and thyroid problems.  Exposure to extreme heat. DIAGNOSIS  Your health care provider will ask about your symptoms, perform a physical exam, and perform an electrocardiogram (ECG) to record the electrical activity of your heart. Your health care provider may also perform other heart or blood tests to determine the cause of your dizziness. These may include:  Transthoracic echocardiogram (TTE). During echocardiography, sound waves are used to evaluate how blood flows through your heart.  Transesophageal echocardiogram (TEE).  Cardiac monitoring. This allows your health care provider to monitor your heart rate and  rhythm in real time.  Holter monitor. This is a portable device that records your heartbeat and can help diagnose heart arrhythmias. It allows your health care provider to track your heart activity for several days if needed.  Stress tests by exercise or by giving medicine that makes the heart beat faster. TREATMENT  Treatment of dizziness depends on the cause of your symptoms and can vary greatly. HOME CARE INSTRUCTIONS   Drink enough fluids to keep your urine clear or pale yellow. This is especially important in very hot weather. In older adults, it is also important in cold weather.  Take your medicine exactly as directed if your dizziness is caused by medicines. When taking blood pressure medicines, it is especially important to get up slowly.  Rise slowly from chairs and steady yourself until you feel okay.  In the morning, first sit up on the side of the bed. When you feel okay, stand slowly while holding onto something until you know your balance is fine.  Move your legs often if you need to stand in one place for a long time. Tighten and relax your muscles in your legs while standing.  Have someone stay with you for 1-2 days if dizziness continues to be a problem. Do this until you feel you are well enough to stay alone. Have the person call your health care provider if he or she notices changes in  you that are concerning.  Do not drive or use heavy machinery if you feel dizzy.  Do not drink alcohol. SEEK IMMEDIATE MEDICAL CARE IF:   Your dizziness or light-headedness gets worse.  You feel nauseous or vomit.  You have problems talking, walking, or using your arms, hands, or legs.  You feel weak.  You are not thinking clearly or you have trouble forming sentences. It may take a friend or family member to notice this.  You have chest pain, abdominal pain, shortness of breath, or sweating.  Your vision changes.  You notice any bleeding.  You have side effects from  medicine that seems to be getting worse rather than better. MAKE SURE YOU:   Understand these instructions.  Will watch your condition.  Will get help right away if you are not doing well or get worse. Document Released: 05/11/2001 Document Revised: 11/20/2013 Document Reviewed: 06/04/2011 [facility] Patient Information 2015 [place], [place]. This information is not intended to replace advice given to you by your health care provider. Make sure you discuss any questions you have with your health care provider.

## 2016-03-11 ENCOUNTER — Other Ambulatory Visit: Payer: Self-pay | Admitting: Family Medicine

## 2016-03-11 DIAGNOSIS — I6523 Occlusion and stenosis of bilateral carotid arteries: Secondary | ICD-10-CM

## 2016-03-11 DIAGNOSIS — H538 Other visual disturbances: Secondary | ICD-10-CM

## 2016-03-22 ENCOUNTER — Ambulatory Visit
Admission: RE | Admit: 2016-03-22 | Discharge: 2016-03-22 | Disposition: A | Discharge status: Home / Self Care | Payer: Medicare Other | Source: Ambulatory Visit | Attending: Family Medicine | Admitting: Family Medicine

## 2016-03-22 DIAGNOSIS — I6523 Occlusion and stenosis of bilateral carotid arteries: Secondary | ICD-10-CM

## 2016-03-22 DIAGNOSIS — I6521 Occlusion and stenosis of right carotid artery: Secondary | ICD-10-CM | POA: Insufficient documentation

## 2016-03-22 DIAGNOSIS — H538 Other visual disturbances: Secondary | ICD-10-CM | POA: Diagnosis present

## 2016-03-22 DIAGNOSIS — I6522 Occlusion and stenosis of left carotid artery: Secondary | ICD-10-CM | POA: Diagnosis present

## 2016-03-29 ENCOUNTER — Other Ambulatory Visit: Payer: Self-pay | Admitting: Vascular Surgery

## 2016-03-29 DIAGNOSIS — I6522 Occlusion and stenosis of left carotid artery: Secondary | ICD-10-CM

## 2016-04-06 ENCOUNTER — Ambulatory Visit
Admission: RE | Admit: 2016-04-06 | Discharge: 2016-04-06 | Disposition: A | Discharge status: Home / Self Care | Payer: Medicare Other | Source: Ambulatory Visit | Attending: Vascular Surgery | Admitting: Vascular Surgery

## 2016-04-06 DIAGNOSIS — I6522 Occlusion and stenosis of left carotid artery: Secondary | ICD-10-CM | POA: Insufficient documentation

## 2016-04-06 DIAGNOSIS — I6529 Occlusion and stenosis of unspecified carotid artery: Secondary | ICD-10-CM | POA: Diagnosis present

## 2016-04-06 LAB — POCT I-STAT CREATININE: Creatinine, Ser: 0.6 mg/dL (ref 0.44–1.00)

## 2016-04-06 MED ORDER — IOPAMIDOL (ISOVUE-370) INJECTION 76%
75.0000 mL | Freq: Once | INTRAVENOUS | Status: AC | PRN
  Administered 2016-04-06: 75 mL via INTRAVENOUS

## 2016-07-29 ENCOUNTER — Other Ambulatory Visit: Payer: Self-pay | Admitting: Family Medicine

## 2016-07-29 DIAGNOSIS — Z13820 Encounter for screening for osteoporosis: Secondary | ICD-10-CM

## 2016-07-29 DIAGNOSIS — Z780 Asymptomatic menopausal state: Secondary | ICD-10-CM

## 2016-07-29 DIAGNOSIS — Z0000 Encounter for general adult medical examination without abnormal findings: Secondary | ICD-10-CM

## 2016-08-31 ENCOUNTER — Other Ambulatory Visit: Payer: Medicare Other

## 2016-08-31 ENCOUNTER — Ambulatory Visit: Payer: Medicare Other

## 2016-09-28 ENCOUNTER — Ambulatory Visit
Admission: RE | Admit: 2016-09-28 | Discharge: 2016-09-28 | Disposition: A | Discharge status: Home / Self Care | Payer: Medicare Other | Source: Ambulatory Visit | Attending: Family Medicine | Admitting: Family Medicine

## 2016-09-28 ENCOUNTER — Encounter: Payer: Self-pay | Admitting: Radiology

## 2016-09-28 DIAGNOSIS — M85852 Other specified disorders of bone density and structure, left thigh: Secondary | ICD-10-CM | POA: Diagnosis not present

## 2016-09-28 DIAGNOSIS — M8588 Other specified disorders of bone density and structure, other site: Secondary | ICD-10-CM | POA: Insufficient documentation

## 2016-09-28 DIAGNOSIS — Z0000 Encounter for general adult medical examination without abnormal findings: Secondary | ICD-10-CM | POA: Diagnosis not present

## 2016-09-28 DIAGNOSIS — Z780 Asymptomatic menopausal state: Secondary | ICD-10-CM

## 2016-09-28 DIAGNOSIS — Z1231 Encounter for screening mammogram for malignant neoplasm of breast: Secondary | ICD-10-CM | POA: Diagnosis not present

## 2016-09-28 DIAGNOSIS — Z13820 Encounter for screening for osteoporosis: Secondary | ICD-10-CM | POA: Diagnosis not present

## 2016-10-05 ENCOUNTER — Other Ambulatory Visit: Payer: Self-pay | Admitting: Family Medicine

## 2016-10-05 DIAGNOSIS — M79651 Pain in right thigh: Secondary | ICD-10-CM

## 2016-10-06 ENCOUNTER — Ambulatory Visit
Admission: RE | Admit: 2016-10-06 | Discharge: 2016-10-06 | Disposition: A | Discharge status: Home / Self Care | Payer: Medicare Other | Source: Ambulatory Visit | Attending: Family Medicine | Admitting: Family Medicine

## 2016-10-06 DIAGNOSIS — M79651 Pain in right thigh: Secondary | ICD-10-CM

## 2016-10-06 HISTORY — DX: Malignant (primary) neoplasm, unspecified: C80.1

## 2016-10-06 LAB — POCT I-STAT CREATININE: Creatinine, Ser: 0.7 mg/dL (ref 0.44–1.00)

## 2016-10-06 MED ORDER — IOPAMIDOL (ISOVUE-300) INJECTION 61%
75.0000 mL | Freq: Once | INTRAVENOUS | Status: AC | PRN
  Administered 2016-10-06: 75 mL via INTRAVENOUS

## 2016-10-08 ENCOUNTER — Ambulatory Visit: Admission: RE | Admit: 2016-10-08 | Payer: Medicare Other | Source: Ambulatory Visit

## 2016-12-13 IMAGING — CT CT ANGIO NECK
2 of 3 series · 8 of 14 positions shown · IV contrast (APPLIED)
Comparison: Carotid Doppler ultrasound 03/22/2016. Neck CTA
04/16/2013.

CLINICAL DATA: 64-year-old female with headache and double vision
history of high-grade stenosis or short segment occlusion of the
proximal left ICA in 7178 with occlusion now on carotid Doppler
ultrasound. Previous carotid surgery. Subsequent encounter.



[Series 4: cta neck · axial · 0.39mm/px · z∈[-283,-201]mm · 2 of 123 slices shown]
[im 41/123  soft-tissue]
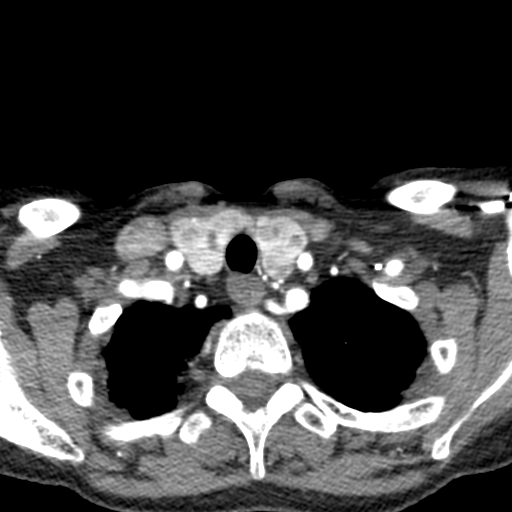
[im 82/123  soft-tissue]
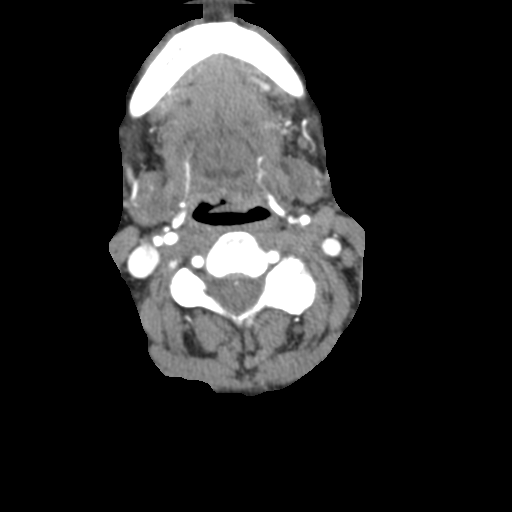

[Series 6: ax thin · axial · 0.39mm/px · z∈[-328,-149]mm · 6 of 241 slices shown]
[im 35/241  soft-tissue]
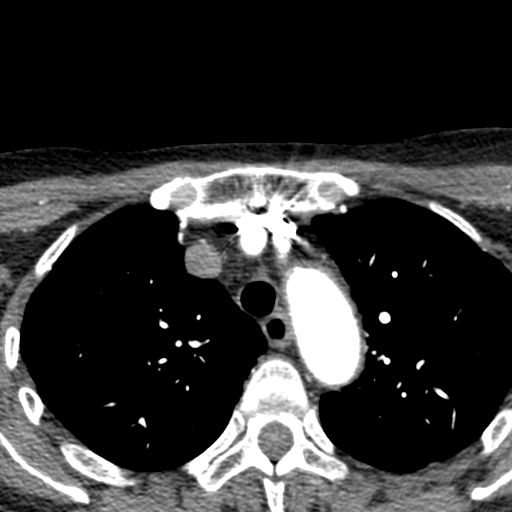
[im 69/241  bone]
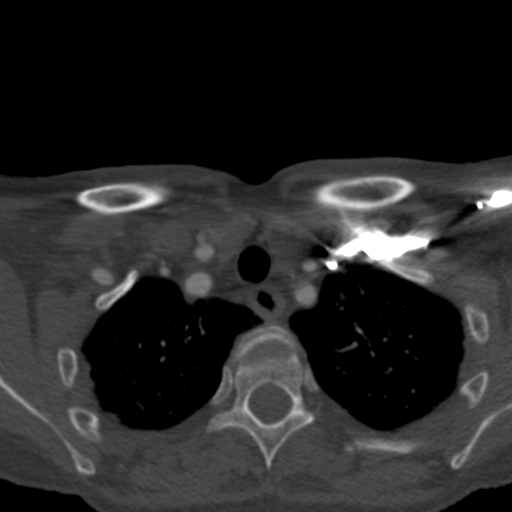
[im 103/241  soft-tissue]
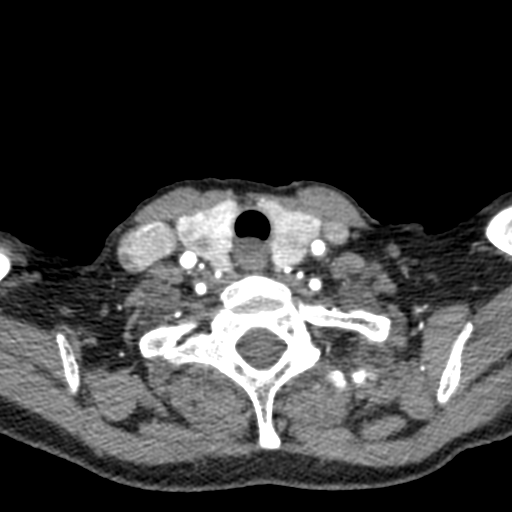
[im 138/241  bone]
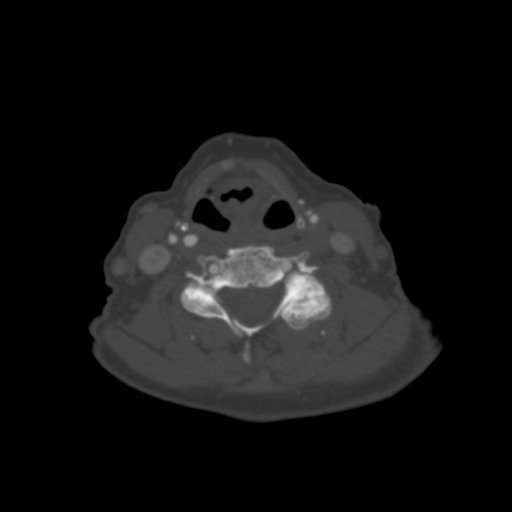
[im 172/241  soft-tissue]
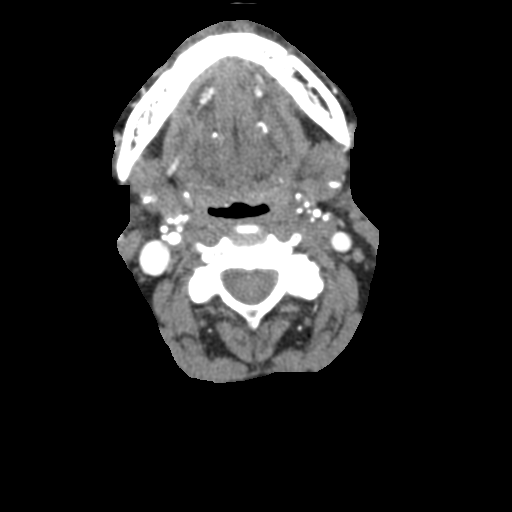
[im 206/241  bone]
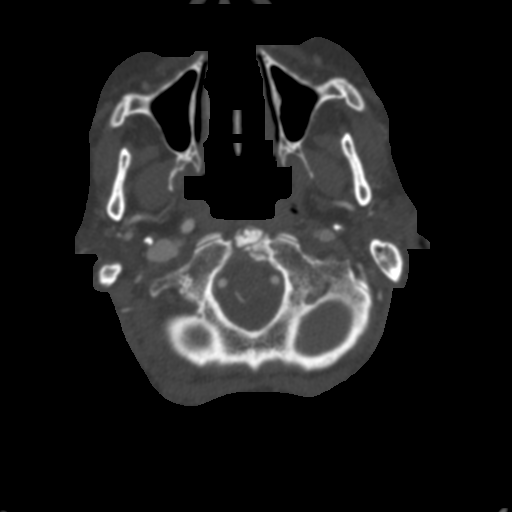

[8 of 14 positions shown; findings below may reference images not displayed]

FINDINGS: Skeleton: Absent dentition. Cervical spine degeneration,
predominantly facet arthropathy. Chronic C6-C7 disc and endplate
degeneration. Chronic anterior C1-odontoid degeneration with
osteophytosis and subchondral cysts. No acute osseous abnormality
identified.

Fluid level in the right sphenoid sinus. Other Visualized paranasal
sinuses and mastoids are clear.

Other neck: Mild centrilobular emphysema in the visualized lung
parenchyma. No superior mediastinal lymphadenopathy.

Chronic multinodular thyroid goiter appears not significantly
changed since 7178.

The glottis is closed. Pharyngeal soft tissue contours are within
normal limits. Negative parapharyngeal and retropharyngeal spaces.
Negative sublingual space, submandibular glands and parotid glands.

No cervical lymphadenopathy.  Negative visualized brain parenchyma.

Aortic arch: 3 vessel arch configuration with chronic soft and
calcified arch atherosclerosis.

Right carotid system: No brachiocephalic artery or right CCA origin
stenosis. Intermittent soft plaque in the right CCA without
stenosis. Soft plaque at the right carotid bifurcation but no right
ICA origin or bulb stenosis. Negative cervical right ICA otherwise.
Visible right ICA siphon is normal.

Left carotid system: Mildly diminutive appearing left CCA remain it
is patent from the arch to the left carotid bifurcation. No definite
left CCA origin stenosis. The left ICA is occluded in the proximal
bulb (series 9, image 127) with no reconstituted flow to the level
of the mid left ICA siphon.

Vertebral arteries:No proximal right subclavian artery stenosis
despite soft and calcified plaque. Normal right vertebral artery
origin. Normal right vertebral to the vertebrobasilar junction.
Normal right PICA origin.

Normal visible basilar artery.

Somewhat bulky soft plaque in the proximal left subclavian artery,
but no hemodynamically significant stenosis. The plaque appears
mildly ulcerated (series 4, image 27). The left vertebral artery
origin remains normal. The vertebrals are codominant and somewhat
hyperplastic. Normal left vertebral artery to the vertebrobasilar
junction. Normal left PICA origin.
IMPRESSION: 1. Left ICA is occluded just beyond its origin, with no
reconstitution in the neck or to the mid left ICA siphon.
2. Soft and calcified aortic and carotid atherosclerosis elsewhere
with no hemodynamically significant stenosis.
3. Normal, somewhat hyperplastic appearing bilateral vertebral
arteries. Left worse than right proximal subclavian artery plaque
without significant stenosis.

## 2017-04-11 ENCOUNTER — Ambulatory Visit (INDEPENDENT_AMBULATORY_CARE_PROVIDER_SITE_OTHER): Payer: Self-pay | Admitting: Vascular Surgery

## 2017-04-11 ENCOUNTER — Encounter (INDEPENDENT_AMBULATORY_CARE_PROVIDER_SITE_OTHER): Payer: Medicare Other
